# Patient Record
Sex: Female | Born: 1960 | Race: White | Hispanic: No | Smoking: Never smoker
Health system: Southern US, Community
[De-identification: ages and names within clinical notes are randomized; demographics above are authoritative.]

## PROBLEM LIST (undated history)

## (undated) DIAGNOSIS — C4491 Basal cell carcinoma of skin, unspecified: Secondary | ICD-10-CM

## (undated) DIAGNOSIS — E039 Hypothyroidism, unspecified: Secondary | ICD-10-CM

## (undated) DIAGNOSIS — K635 Polyp of colon: Secondary | ICD-10-CM

## (undated) DIAGNOSIS — A6000 Herpesviral infection of urogenital system, unspecified: Secondary | ICD-10-CM

## (undated) DIAGNOSIS — R0600 Dyspnea, unspecified: Secondary | ICD-10-CM

## (undated) HISTORY — DX: Polyp of colon: K63.5

## (undated) HISTORY — DX: Herpesviral infection of urogenital system, unspecified: A60.00

## (undated) HISTORY — DX: Basal cell carcinoma of skin, unspecified: C44.91

## (undated) HISTORY — DX: Hypothyroidism, unspecified: E03.9

## (undated) HISTORY — PX: OTHER SURGICAL HISTORY: SHX169

## (undated) HISTORY — DX: Dyspnea, unspecified: R06.00

---
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
## 2002-06-24 HISTORY — PX: CHOLECYSTECTOMY: SHX55

## 2004-05-14 ENCOUNTER — Ambulatory Visit: Payer: Self-pay | Admitting: Internal Medicine

## 2004-06-05 ENCOUNTER — Ambulatory Visit: Payer: Self-pay | Admitting: Internal Medicine

## 2004-08-09 ENCOUNTER — Ambulatory Visit: Payer: Self-pay | Admitting: Family Medicine

## 2004-10-29 ENCOUNTER — Ambulatory Visit: Payer: Self-pay | Admitting: Internal Medicine

## 2004-12-12 ENCOUNTER — Ambulatory Visit: Payer: Self-pay | Admitting: Internal Medicine

## 2005-01-19 ENCOUNTER — Ambulatory Visit: Payer: Self-pay | Admitting: Internal Medicine

## 2005-03-08 ENCOUNTER — Ambulatory Visit: Payer: Self-pay | Admitting: Internal Medicine

## 2005-06-21 ENCOUNTER — Encounter: Payer: Self-pay | Admitting: Internal Medicine

## 2005-06-21 ENCOUNTER — Ambulatory Visit: Payer: Self-pay | Admitting: Internal Medicine

## 2005-06-21 ENCOUNTER — Other Ambulatory Visit: Admission: RE | Admit: 2005-06-21 | Discharge: 2005-06-21 | Payer: Self-pay | Admitting: Internal Medicine

## 2006-03-01 ENCOUNTER — Ambulatory Visit: Payer: Self-pay | Admitting: Family Medicine

## 2006-05-13 ENCOUNTER — Ambulatory Visit: Payer: Self-pay | Admitting: Internal Medicine

## 2006-06-16 ENCOUNTER — Ambulatory Visit: Payer: Self-pay | Admitting: Internal Medicine

## 2006-06-16 LAB — CONVERTED CEMR LAB
ALT: 22 units/L (ref 0–40)
CO2: 30 meq/L (ref 19–32)
Chloride: 112 meq/L (ref 96–112)
Cholesterol: 165 mg/dL (ref 0–200)
Creatinine, Ser: 0.7 mg/dL (ref 0.4–1.2)
Glucose, Bld: 78 mg/dL (ref 70–99)
HDL: 56.8 mg/dL (ref >39.0)
MCHC: 34.2 g/dL (ref 30.0–36.0)
MCV: 98.6 fL (ref 78.0–100.0)
Platelets: 215 10*3/uL (ref 150–400)
RBC: 4.03 M/uL (ref 3.87–5.11)
RDW: 11.6 % (ref 11.5–14.6)
Sodium: 145 meq/L (ref 135–145)
Triglyceride fasting, serum: 57 mg/dL (ref 0–149)
VLDL: 11 mg/dL (ref 0–40)

## 2006-09-09 ENCOUNTER — Ambulatory Visit: Payer: Self-pay | Admitting: Internal Medicine

## 2007-02-05 ENCOUNTER — Telehealth (INDEPENDENT_AMBULATORY_CARE_PROVIDER_SITE_OTHER): Payer: Self-pay | Admitting: *Deleted

## 2007-03-19 ENCOUNTER — Ambulatory Visit: Payer: Self-pay | Admitting: Internal Medicine

## 2007-03-19 LAB — CONVERTED CEMR LAB
Bilirubin Urine: NEGATIVE
Blood in Urine, dipstick: NEGATIVE
Glucose, Urine, Semiquant: NEGATIVE
Ketones, urine, test strip: NEGATIVE
Protein, U semiquant: NEGATIVE
Urobilinogen, UA: NEGATIVE

## 2007-04-22 ENCOUNTER — Telehealth (INDEPENDENT_AMBULATORY_CARE_PROVIDER_SITE_OTHER): Payer: Self-pay | Admitting: *Deleted

## 2007-04-23 ENCOUNTER — Encounter (INDEPENDENT_AMBULATORY_CARE_PROVIDER_SITE_OTHER): Payer: Self-pay | Admitting: *Deleted

## 2007-04-27 ENCOUNTER — Telehealth (INDEPENDENT_AMBULATORY_CARE_PROVIDER_SITE_OTHER): Payer: Self-pay | Admitting: *Deleted

## 2007-06-17 ENCOUNTER — Ambulatory Visit: Payer: Self-pay | Admitting: Internal Medicine

## 2007-06-19 ENCOUNTER — Ambulatory Visit: Payer: Self-pay | Admitting: Internal Medicine

## 2007-06-19 LAB — CONVERTED CEMR LAB: Rapid Strep: NEGATIVE

## 2008-02-16 ENCOUNTER — Telehealth: Payer: Self-pay | Admitting: Internal Medicine

## 2008-02-17 ENCOUNTER — Ambulatory Visit: Payer: Self-pay | Admitting: Internal Medicine

## 2008-03-04 ENCOUNTER — Encounter: Payer: Self-pay | Admitting: Internal Medicine

## 2008-04-07 ENCOUNTER — Telehealth: Payer: Self-pay | Admitting: Internal Medicine

## 2008-04-12 ENCOUNTER — Telehealth (INDEPENDENT_AMBULATORY_CARE_PROVIDER_SITE_OTHER): Payer: Self-pay | Admitting: *Deleted

## 2008-04-19 ENCOUNTER — Ambulatory Visit: Payer: Self-pay | Admitting: Family Medicine

## 2008-06-21 ENCOUNTER — Ambulatory Visit: Payer: Self-pay | Admitting: Internal Medicine

## 2008-08-30 ENCOUNTER — Telehealth: Payer: Self-pay | Admitting: Internal Medicine

## 2008-08-30 ENCOUNTER — Ambulatory Visit: Payer: Self-pay | Admitting: Internal Medicine

## 2008-08-31 ENCOUNTER — Telehealth: Payer: Self-pay | Admitting: Internal Medicine

## 2008-08-31 ENCOUNTER — Ambulatory Visit: Payer: Self-pay | Admitting: Internal Medicine

## 2008-09-07 DIAGNOSIS — E039 Hypothyroidism, unspecified: Secondary | ICD-10-CM | POA: Insufficient documentation

## 2008-09-08 ENCOUNTER — Ambulatory Visit: Payer: Self-pay | Admitting: Cardiovascular Disease

## 2008-09-21 ENCOUNTER — Ambulatory Visit: Payer: Self-pay

## 2008-09-21 ENCOUNTER — Encounter: Payer: Self-pay | Admitting: Cardiovascular Disease

## 2008-09-26 ENCOUNTER — Ambulatory Visit: Payer: Self-pay | Admitting: Internal Medicine

## 2008-09-26 ENCOUNTER — Ambulatory Visit: Payer: Self-pay | Admitting: Cardiology

## 2008-09-27 ENCOUNTER — Encounter (INDEPENDENT_AMBULATORY_CARE_PROVIDER_SITE_OTHER): Payer: Self-pay | Admitting: *Deleted

## 2008-09-28 ENCOUNTER — Telehealth: Payer: Self-pay | Admitting: Internal Medicine

## 2008-09-30 ENCOUNTER — Encounter: Payer: Self-pay | Admitting: Internal Medicine

## 2009-02-28 ENCOUNTER — Ambulatory Visit: Payer: Self-pay | Admitting: Family Medicine

## 2009-03-09 ENCOUNTER — Emergency Department (HOSPITAL_BASED_OUTPATIENT_CLINIC_OR_DEPARTMENT_OTHER): Admission: EM | Admit: 2009-03-09 | Discharge: 2009-03-10 | Payer: Self-pay | Admitting: Emergency Medicine

## 2009-03-09 ENCOUNTER — Ambulatory Visit: Payer: Self-pay | Admitting: Diagnostic Radiology

## 2009-03-09 ENCOUNTER — Encounter: Payer: Self-pay | Admitting: Internal Medicine

## 2009-03-10 ENCOUNTER — Telehealth (INDEPENDENT_AMBULATORY_CARE_PROVIDER_SITE_OTHER): Payer: Self-pay | Admitting: *Deleted

## 2009-04-14 ENCOUNTER — Ambulatory Visit: Payer: Self-pay | Admitting: Family Medicine

## 2009-05-01 ENCOUNTER — Telehealth (INDEPENDENT_AMBULATORY_CARE_PROVIDER_SITE_OTHER): Payer: Self-pay | Admitting: *Deleted

## 2009-07-06 ENCOUNTER — Telehealth (INDEPENDENT_AMBULATORY_CARE_PROVIDER_SITE_OTHER): Payer: Self-pay | Admitting: *Deleted

## 2009-07-12 ENCOUNTER — Ambulatory Visit: Payer: Self-pay | Admitting: Internal Medicine

## 2009-07-20 ENCOUNTER — Telehealth: Payer: Self-pay | Admitting: Internal Medicine

## 2009-07-20 ENCOUNTER — Telehealth (INDEPENDENT_AMBULATORY_CARE_PROVIDER_SITE_OTHER): Payer: Self-pay | Admitting: *Deleted

## 2009-07-25 ENCOUNTER — Encounter: Payer: Self-pay | Admitting: Internal Medicine

## 2010-04-17 ENCOUNTER — Telehealth (INDEPENDENT_AMBULATORY_CARE_PROVIDER_SITE_OTHER): Payer: Self-pay | Admitting: *Deleted

## 2010-07-20 ENCOUNTER — Other Ambulatory Visit: Payer: Self-pay | Admitting: Internal Medicine

## 2010-07-20 ENCOUNTER — Ambulatory Visit
Admission: RE | Admit: 2010-07-20 | Discharge: 2010-07-20 | Payer: Self-pay | Source: Home / Self Care | Attending: Internal Medicine | Admitting: Internal Medicine

## 2010-07-20 DIAGNOSIS — A6000 Herpesviral infection of urogenital system, unspecified: Secondary | ICD-10-CM | POA: Insufficient documentation

## 2010-07-20 LAB — CBC WITH DIFFERENTIAL/PLATELET
Eosinophils Relative: 2.2 % (ref 0.0–5.0)
HCT: 37.2 % (ref 36.0–46.0)
Lymphs Abs: 1.8 10*3/uL (ref 0.7–4.0)
MCV: 97.9 fl (ref 78.0–100.0)
Monocytes Absolute: 0.3 10*3/uL (ref 0.1–1.0)
Platelets: 197 10*3/uL (ref 150.0–400.0)
RDW: 11.9 % (ref 11.5–14.6)
WBC: 5.4 10*3/uL (ref 4.5–10.5)

## 2010-07-20 LAB — ALT: ALT: 19 U/L (ref 0–35)

## 2010-07-20 LAB — BASIC METABOLIC PANEL
BUN: 14 mg/dL (ref 6–23)
CO2: 27 mEq/L (ref 19–32)
Chloride: 107 mEq/L (ref 96–112)
Glucose, Bld: 79 mg/dL (ref 70–99)
Potassium: 4.1 mEq/L (ref 3.5–5.1)

## 2010-07-20 LAB — LIPID PANEL
HDL: 59.2 mg/dL (ref >39.00)
Total CHOL/HDL Ratio: 3

## 2010-07-20 LAB — TSH: TSH: 0.16 u[IU]/mL — ABNORMAL LOW (ref 0.35–5.50)

## 2010-07-22 LAB — CONVERTED CEMR LAB
ALT: 18 units/L (ref 0–35)
ALT: 21 units/L (ref 0–35)
AST: 21 units/L (ref 0–37)
Albumin: 3.8 g/dL (ref 3.5–5.2)
Albumin: 4 g/dL (ref 3.5–5.2)
Albumin: 4.1 g/dL (ref 3.5–5.2)
Alkaline Phosphatase: 46 units/L (ref 39–117)
Alkaline Phosphatase: 51 units/L (ref 39–117)
Alkaline Phosphatase: 52 units/L (ref 39–117)
BUN: 10 mg/dL (ref 6–23)
BUN: 11 mg/dL (ref 6–23)
Basophils Absolute: 0 10*3/uL (ref 0.0–0.1)
Basophils Absolute: 0 10*3/uL (ref 0.0–0.1)
Basophils Relative: 0.1 % (ref 0.0–1.0)
Basophils Relative: 0.3 % (ref 0.0–3.0)
CO2: 29 meq/L (ref 19–32)
CO2: 29 meq/L (ref 19–32)
Calcium: 9 mg/dL (ref 8.4–10.5)
Calcium: 9.1 mg/dL (ref 8.4–10.5)
Chloride: 105 meq/L (ref 96–112)
Cholesterol: 168 mg/dL (ref 0–200)
Cholesterol: 169 mg/dL (ref 0–200)
Eosinophils Absolute: 0.2 10*3/uL (ref 0.0–0.7)
Eosinophils Relative: 3.8 % (ref 0.0–5.0)
Folate: 15.8 ng/mL
GFR calc Af Amer: 116 mL/min
GFR calc Af Amer: 116 mL/min
GFR calc non Af Amer: 95.2 mL/min (ref >60)
GFR calc non Af Amer: 96 mL/min
HCT: 38.9 % (ref 36.0–46.0)
HCT: 39.8 % (ref 36.0–46.0)
HDL: 58.4 mg/dL (ref >39.0)
HDL: 58.9 mg/dL (ref >39.0)
Hemoglobin: 13.2 g/dL (ref 12.0–15.0)
LDL Cholesterol: 94 mg/dL (ref 0–99)
Lymphocytes Relative: 33.2 % (ref 12.0–46.0)
Lymphs Abs: 1.6 10*3/uL (ref 0.7–4.0)
MCHC: 33.3 g/dL (ref 30.0–36.0)
MCHC: 35 g/dL (ref 30.0–36.0)
MCHC: 35 g/dL (ref 30.0–36.0)
MCV: 93.9 fL (ref 78.0–100.0)
Monocytes Absolute: 0.4 10*3/uL (ref 0.2–0.7)
Monocytes Absolute: 0.6 10*3/uL (ref 0.1–1.0)
Monocytes Relative: 6.1 % (ref 3.0–11.0)
Monocytes Relative: 6.4 % (ref 3.0–12.0)
Neutro Abs: 2.8 10*3/uL (ref 1.4–7.7)
Neutro Abs: 3.8 10*3/uL (ref 1.4–7.7)
Neutrophils Relative %: 46.7 % (ref 43.0–77.0)
Pap Smear: NORMAL
Platelets: 177 10*3/uL (ref 150–400)
Platelets: 193 10*3/uL (ref 150–400)
Potassium: 3.9 meq/L (ref 3.5–5.1)
Potassium: 4 meq/L (ref 3.5–5.1)
Potassium: 4 meq/L (ref 3.5–5.1)
RBC: 4.03 M/uL (ref 3.87–5.11)
RDW: 11.2 % — ABNORMAL LOW (ref 11.5–14.6)
RDW: 11.3 % — ABNORMAL LOW (ref 11.5–14.6)
Sodium: 138 meq/L (ref 135–145)
Sodium: 140 meq/L (ref 135–145)
TSH: 0.46 microintl units/mL (ref 0.35–5.50)
TSH: 0.97 microintl units/mL (ref 0.35–5.50)
Total Bilirubin: 1.4 mg/dL — ABNORMAL HIGH (ref 0.3–1.2)
Total Protein: 6.7 g/dL (ref 6.0–8.3)
Total Protein: 6.9 g/dL (ref 6.0–8.3)
Triglycerides: 75 mg/dL (ref 0–149)
Triglycerides: 83 mg/dL (ref 0–149)
VLDL: 15 mg/dL (ref 0–40)
VLDL: 17 mg/dL (ref 0–40)
Vit D, 25-Hydroxy: 36 ng/mL (ref 30–89)
Vitamin B-12: 506 pg/mL (ref 211–911)

## 2010-07-24 NOTE — Consult Note (Signed)
Summary: [facility]   Imported By: Lanelle Bal 08/02/2009 08:50:33  _____________________________________________________________________  External Attachment:    Type:   Image     Comment:   External Document

## 2010-07-24 NOTE — Progress Notes (Signed)
Summary: refill  Phone Note Refill Request Message from:  [facility] fax [phone]  Refills Requested: Medication #1:  SYNTHROID 112 MCG TABS 1 by mouth once daily Initial call taken by: Barb Merino,  July 20, 2009 8:30 AM    Prescriptions: SYNTHROID 112 MCG TABS (LEVOTHYROXINE SODIUM) 1 by mouth once daily  #90 x 1   Entered by:   Kandice Hams   Authorized by:   Nolon Rod. Paz MD   Signed by:   Kandice Hams on 07/20/2009   Method used:   Printed then faxed to ...       [facility])       [address] [address]       [place], [place]  [address]       Ph: [phone]       Fax: [phone]   RxID:   [id number]

## 2010-07-24 NOTE — Progress Notes (Signed)
Summary: ? lab results  Phone Note Call from Patient Call back at Home Phone [phone] Call back at Work Phone [phone]   Details for Reason: PATIENT LEFT MESSAGE ON VM CONCERNED ABOUT LABS: Summary of Call: Patient was reviewing copy of labs that she received in mail.  She wants to know 1) if her low RDW is what is causing her to feel bad & 2) should she be concerned that her bili is elevated because it has been elevated before.  Is that causing any of her problems? Initial call taken by: Shary Decamp,  July 20, 2009 1:29 PM  Follow-up for Phone Call        -- I don't believe the slightly  abnormal RDW or elevated bilirrubin  are  causing any symptoms. --since her other LFTs are normal the increase of bilir. just need to be monitored from time to time  [place] E. Shooter Tangen MD  July 20, 2009 5:23 PM   Left message on machine for pt to return call Shary Decamp  July 21, 2009 11:17 AM  discussed with pt Shary Decamp  July 21, 2009 11:59 AM

## 2010-07-24 NOTE — Assessment & Plan Note (Signed)
Summary: CPX, FASTING, NS FEE/RH.....   Vital Signs:  Patient profile:   50 year old female Height:      63 inches Weight:      150.6 pounds BMI:     26.77 Pulse rate:   60 / minute Pulse rhythm:   regular BP sitting:   120 / 80  (left arm) Cuff size:   regular  Vitals Entered By: Shary Decamp (July 12, 2009 7:59 AM) CC: cpx - fasting, no pap (has gyn) Is Patient Diabetic? No Comments  - fatigue x 6 months  - jaw popping when she opens her mouth off & on x 1 year  - still c/io of chest pains - she started exercising again & c/o of pain with exercise & dull pain after exercise Shary Decamp  July 12, 2009 8:05 AM    History of Present Illness: CPX and several other issues :    generalize fatigue x 6 months, "hard to get up from bed", no snoring that she knows off  jaw popping when she opens her mouth off & on x 1 year, if put pressure on it feels better    still c/io of chest pains, exertional, R from the sternum, decrease w/ rest after exertion, the chest remains sore to touch  able to run 12 miles/week   Preventive Screening-Counseling & Management  Alcohol-Tobacco     Alcohol drinks/day: 0     Smoking Status: never  Caffeine-Diet-Exercise     Caffeine use/day: 2     Does Patient Exercise: yes     Times/week: 3  Current Medications (verified): 1)  Synthroid 112 Mcg Tabs (Levothyroxine Sodium) .Marland Kitchen.. 1 By Mouth Once Daily 2)  Acyclovir 400 Mg Tabs (Acyclovir) .... Two Times A Day 3)  Nature's Bounty Vitamins For Women 4)  Balziva 0.4-35 Mg-Mcg Tabs (Norethindrone-Eth Estradiol) .... Once Daily  Allergies (verified): No Known Drug Allergies  Past History:  Past Medical History: Hypothyroidism h/o colon polyps  BCC, s/p MOHs 2009 DOE- CP, 3-10 normal stress  ECHO, 4-10 CT neg for PE, saw pulmonary  Past Surgical History: Reviewed history from 06/17/2007 and no changes required. Appendectomy Caesarean section Cholecystectomy uterine  ablation  Family History: DM-- aunts CAD--no stroke -- M age 33 dementia (early)-- mother Breast ca-G-aunt Ovarian Ca--aunts colon ca--distant relative (F uncle) polyps, colon--father depression--father  Social History: Reviewed history from 09/07/2008 and no changes required. Single 1 child tobacco--no ETOH-- rarely  Occupation:finances (sedentary) very active runner Does Patient Exercise:  yes Caffeine use/day:  2  Review of Systems General:  Denies fever and weight loss; "can't loose wt despite running 12  miles a week". CV:  Denies near fainting, palpitations, and swelling of feet. Resp:  Denies cough and wheezing. GI:  Denies bloody stools, diarrhea, nausea, and vomiting. GU:  sees gyn no periods . Psych:  Denies anxiety and depression.  Physical Exam  General:  alert, well-developed, and well-nourished.   Mouth:  TMJ no clik Neck:  no masses and no thyromegaly.   Lungs:  normal respiratory effort, no intercostal retractions, no accessory muscle use, and normal breath sounds.   Heart:  normal rate, regular rhythm, no murmur, and no gallop.   Abdomen:  soft, non-tender, no distention, no masses, no guarding, and no rigidity.   Extremities:  no pretibial edema bilaterally  Psych:  Oriented X3, memory intact for recent and remote, normally interactive, good eye contact, not anxious appearing, and not depressed appearing.     Impression &  Recommendations:  Problem # 1:  HEALTH SCREENING (ICD-V70.0) Td 2007 had flu shot  Cscope x 2 ,  06-2005 (in [facility]) they found polyps; repeated Cscope 2008 normal.  next 5 years  (per patient ) cont healthy life style female care per gyn    Orders: Venipuncture (60454) TLB-BMP (Basic Metabolic Panel-BMET) (80048-METABOL) TLB-CBC Platelet - w/Differential (85025-CBCD) TLB-Hepatic/Liver Function Pnl (80076-HEPATIC) TLB-Lipid Panel (80061-LIPID)  Problem # 2:  CHEST PAIN (ICD-786.50) persistent  exertional CP chart  reviewed ---->   3-10 normal stress  ECHO, 4-10 CT neg for PE, saw pulmonary (no evidence of asthma) w/u has been reassuring she is able to run 12 miles/week despite atypical CP EKG today at baseline costochondral pain  related to exercise ? her options are: observe Cards reavaluation pulmonary reevaluation  patient would prefers a re-eval by cards in [facility] if all the labs are ok  Orders: EKG w/ Interpretation (93000)  Problem # 3:  HYPOTHYROIDISM (ICD-244.9) due for labs  Her updated medication list for this problem includes:    Synthroid 112 Mcg Tabs (Levothyroxine sodium) .Marland Kitchen... 1 by mouth once daily  Labs Reviewed: TSH: 0.55 (06/21/2008)    Chol: 169 (06/21/2008)   HDL: 58.4 (06/21/2008)   LDL: 94 (06/21/2008)   TG: 83 (06/21/2008)  Orders: TLB-TSH (Thyroid Stimulating Hormone) (84443-TSH)  Problem # 4:  FATIGUE (ICD-780.79) see HPI labs will help r/o secondary etiologies   Orders: T-Vitamin D (25-Hydroxy) (09811-91478) TLB-B12 + Folate Pnl (29562_13086-V78/ION)  Problem # 5:  TMJ PAIN (ICD-524.62) rec  dental eval   Complete Medication List: 1)  Synthroid 112 Mcg Tabs (Levothyroxine sodium) .Marland Kitchen.. 1 by mouth once daily 2)  Acyclovir 400 Mg Tabs (Acyclovir) .... Two times a day 3)  Nature's Bounty Vitamins For Women  4)  Balziva 0.4-35 Mg-mcg Tabs (Norethindrone-eth estradiol) .... Once daily  Patient Instructions: 1)  Please schedule a follow-up appointment in 3  months .    Preventive Care Screening  Mammogram:    Date:  10/22/2008    Results:  normal   Pap Smear:    Date:  05/24/2008    Results:  normal     Immunization History:  Tetanus/Td Immunization History:    Tetanus/Td:  per pt (06/24/2005)  Influenza Immunization History:    Influenza:  done @ work (03/24/2009)

## 2010-07-24 NOTE — Progress Notes (Signed)
  Phone Note Call from Patient Call back at Work Phone [phone]   Caller: Patient Summary of Call: pt called having increased chest pains not every day mostly after exercising running or swimming, would like a referral to a heart doctor in the [place] area. pt has appt for cpx next wed 07/12/09. Informed pt would need ov to be seen, pt says she has appt next week. Recommend to pt is signs increase or worsen between now and then recommend ED for assessment, pt agreed and she is familiarwith [facility]  Initial call taken by: Kandice Hams,  July 06, 2009 11:38 AM

## 2010-07-24 NOTE — Progress Notes (Signed)
Summary: Thrombosed Hemorrhoid  Thrombosed Hemorrhoid   Imported By: Maryln Gottron 12/11/2009 15:52:24  _____________________________________________________________________  External Attachment:    Type:   Image     Comment:   External Document

## 2010-07-24 NOTE — Progress Notes (Signed)
Summary: REFILL--LMOM 10/25, appt sched--has question  Phone Note Refill Request Call back at Work Phone [phone] Message from:  Patient on April 17, 2010 8:40 AM  Refills Requested: Medication #1:  SYNTHROID 112 MCG TABS 1 by mouth once daily   Dosage confirmed as above?Dosage Confirmed   Supply Requested: 3 months   Last Refilled: 10/09/2009 [facility]  Initial call taken by: Lavell Islam,  April 17, 2010 8:41 AM  Follow-up for Phone Call        Pt needs an appt with Dr. Drue Novel. Follow-up by: Army Fossa CMA,  April 17, 2010 8:51 AM  Additional Follow-up for Phone Call Additional follow up Details #1::        LMOM (WORK NUMBER) TO CALL AND SCHEDULE OFFICE VISIT  WITH DR PAZ---LAST CPX = 07/12/2009 Additional Follow-up by: Jerolyn Shin,  April 17, 2010 12:43 PM    Additional Follow-up for Phone Call Additional follow up Details #2::    patient has scheduled CPX and fasting labs for 07/17/2010 at 8:00---does she need to be seen any earlier than this appointment?  YES?--please call work number = [phone] or home = [phone]  NO?--does not need phone call   Follow-up by: Jerolyn Shin,  April 17, 2010 1:04 PM  Additional Follow-up for Phone Call Additional follow up Details #3:: Details for Additional Follow-up Action Taken: that appt will be fine. Army Fossa CMA  April 17, 2010 1:08 PM   Prescriptions: SYNTHROID 112 MCG TABS (LEVOTHYROXINE SODIUM) 1 by mouth once daily  #90 x 0   Entered by:   Army Fossa CMA   Authorized by:   Nolon Rod. Paz MD   Signed by:   Army Fossa CMA on 04/17/2010   Method used:   Faxed to ...       [facility] Fax) (mail-order)             ,          Ph: [phone]       Fax: [phone]   RxID:   [id number]

## 2010-08-01 NOTE — Assessment & Plan Note (Signed)
Summary: cpx and fasting labs///sph   Vital Signs:  Patient profile:   50 year old female Height:      63 inches Weight:      145.19 pounds BMI:     25.81 Pulse rate:   76 / minute Pulse rhythm:   regular BP sitting:   110 / 80  (left arm) Cuff size:   regular  Vitals Entered By: Army Fossa CMA (July 20, 2010 7:56 AM) CC: CPX, fasting  Comments no complaints  discuss synthroid [facility] [address] due for pap in feb   History of Present Illness: CPX doing well  Preventive Screening-Counseling & Management  Alcohol-Tobacco     Alcohol type: rare  Current Medications (verified): 1)  Synthroid 112 Mcg Tabs (Levothyroxine Sodium) .Marland Kitchen.. 1 By Mouth Once Daily 2)  Valacyclovir Hcl 1 Gm Tabs (Valacyclovir Hcl) .... 1/2 Tab By Mouth Two Times A Day. 3)  Balziva 0.4-35 Mg-Mcg Tabs (Norethindrone-Eth Estradiol) .... Once Daily  Allergies (verified): No Known Drug Allergies  Past History:  Past Medical History: Reviewed history from 07/12/2009 and no changes required. Hypothyroidism h/o colon polyps  BCC, s/p MOHs 2009 DOE- CP, 3-10 normal stress  ECHO, 4-10 CT neg for PE, saw pulmonary  Past Surgical History: Reviewed history from 06/17/2007 and no changes required. Appendectomy Caesarean section Cholecystectomy uterine ablation  Family History: Reviewed history from 07/12/2009 and no changes required. DM-- aunts CAD--no stroke -- M age 36 dementia (early)-- mother Breast ca-G-aunt Ovarian Ca--aunts colon ca--distant relative (F uncle) polyps, colon--father depression--father  Social History: Single 1 child, boy 54 y/o  tobacco--no ETOH-- rarely  Occupation:finances (sedentary) very active runner  diet-- good   Review of Systems General:  Denies fever and weight loss; mild fatigue (she is a runner, doing boot camp!!). CV:  Denies chest pain or discomfort and swelling of feet. Resp:  Denies cough, shortness of breath, and sputum productive. GI:   Denies bloody stools, diarrhea, nausea, and vomiting. Psych:  Denies anxiety and depression.  Physical Exam  General:  alert, well-developed, and well-nourished.   Neck:  no masses and no thyromegaly.  normal carotid pulse Lungs:  normal respiratory effort, no intercostal retractions, no accessory muscle use, and normal breath sounds.   Heart:  normal rate, regular rhythm, no murmur, and no gallop.   Abdomen:  soft, non-tender, no distention, no masses, no guarding, and no rigidity.   Extremities:  no pretibial edema bilaterally  Psych:  Cognition and judgment appear intact. Alert and cooperative with normal attention span and concentration.     Impression & Recommendations:  Problem # 1:  HEALTH SCREENING (ICD-V70.0) doing great Td 2007 recommend flu shot   Cscope x 2 ,  06-2005 (in [facility]) they found polyps; repeated Cscope 2008 normal.  next 5 years  (per patient )  cont healthy life style female care per gyn  Orders: Venipuncture (16109) TLB-BMP (Basic Metabolic Panel-BMET) (80048-METABOL) TLB-CBC Platelet - w/Differential (85025-CBCD) TLB-ALT (SGPT) (84460-ALT) TLB-AST (SGOT) (84450-SGOT) TLB-Lipid Panel (80061-LIPID) TLB-TSH (Thyroid Stimulating Hormone) (84443-TSH) Specimen Handling (60454)  Problem # 2:  HYPOTHYROIDISM (ICD-244.9) would like to increase dose  a little bit if possible, we agreed on a TSH goal close to 0.3  adjust medicines if necessary Her updated medication list for this problem includes:    Synthroid 112 Mcg Tabs (Levothyroxine sodium) .Marland Kitchen... 1 by mouth once daily  Labs Reviewed: TSH: 0.97 (07/12/2009)    Chol: 184 (07/12/2009)   HDL: 62.90 (07/12/2009)   LDL: 105 (  07/12/2009)   TG: 83.0 (07/12/2009)  Problem # 3:  GENITAL HERPES (ICD-054.10) takes Valtrex 1 g half tablet daily, only takes it twice a day if she feels a episode is coming  Complete Medication List: 1)  Synthroid 112 Mcg Tabs (Levothyroxine sodium) .Marland Kitchen.. 1 by mouth once  daily 2)  Valacyclovir Hcl 1 Gm Tabs (Valacyclovir hcl) .... 1/2 tab by mouth two times a day. 3)  Balziva 0.4-35 Mg-mcg Tabs (Norethindrone-eth estradiol) .... Once daily  Patient Instructions: 1)  Please schedule a follow-up appointment in 1 year.    Orders Added: 1)  Venipuncture [36415] 2)  TLB-BMP (Basic Metabolic Panel-BMET) [80048-METABOL] 3)  TLB-CBC Platelet - w/Differential [85025-CBCD] 4)  TLB-ALT (SGPT) [84460-ALT] 5)  TLB-AST (SGOT) [84450-SGOT] 6)  TLB-Lipid Panel [80061-LIPID] 7)  TLB-TSH (Thyroid Stimulating Hormone) [84443-TSH] 8)  Specimen Handling [99000] 9)  Est. Patient age 33-64 [87]     Risk Factors:  Alcohol use:  yes    Type:  rare  Mammogram History:     Date of Last Mammogram:  10/22/2009    Results:  normal     Preventive Care Screening  Mammogram:    Date:  10/22/2009    Results:  normal

## 2010-08-09 ENCOUNTER — Telehealth: Payer: Self-pay | Admitting: Internal Medicine

## 2010-08-15 ENCOUNTER — Encounter (INDEPENDENT_AMBULATORY_CARE_PROVIDER_SITE_OTHER): Payer: Self-pay | Admitting: *Deleted

## 2010-08-15 ENCOUNTER — Other Ambulatory Visit (INDEPENDENT_AMBULATORY_CARE_PROVIDER_SITE_OTHER): Payer: PRIVATE HEALTH INSURANCE

## 2010-08-15 ENCOUNTER — Other Ambulatory Visit: Payer: Self-pay | Admitting: Internal Medicine

## 2010-08-15 DIAGNOSIS — E039 Hypothyroidism, unspecified: Secondary | ICD-10-CM

## 2010-08-15 LAB — TSH: TSH: 0.35 u[IU]/mL (ref 0.35–5.50)

## 2010-08-15 NOTE — Progress Notes (Signed)
Summary: Synthroid issue  Phone Note Call from Patient Call back at Work Phone [phone]   Summary of Call: Patient notes that her synthroid was changed recently and she notes that she has gained 5 pounds in a week and can sleep all the time. Patient would like this med adjusted. Please advise.  Initial call taken by: Lucious Groves CMA,  August 09, 2010 8:05 AM  Follow-up for Phone Call        her Synthroid was decreased  from 112 to 100 micrograms daily based on her last TSH which was very low. Is she taking 100 mcg once a day? Please get a TSH Follow-up by: Bearett Porcaro E. Cerria Randhawa MD,  August 09, 2010 12:28 PM  Additional Follow-up for Phone Call Additional follow up Details #1::        Patient notified and confirms that she is only taking once daily. Lucious Groves CMA  August 09, 2010 2:27 PM

## 2010-09-28 LAB — COMPREHENSIVE METABOLIC PANEL
Alkaline Phosphatase: 77 U/L (ref 39–117)
BUN: 16 mg/dL (ref 6–23)
Calcium: 9.8 mg/dL (ref 8.4–10.5)
Creatinine, Ser: 0.8 mg/dL (ref 0.4–1.2)
Glucose, Bld: 87 mg/dL (ref 70–99)
Total Protein: 7.2 g/dL (ref 6.0–8.3)

## 2010-09-28 LAB — DIFFERENTIAL
Basophils Relative: 0 % (ref 0–1)
Monocytes Relative: 6 % (ref 3–12)
Neutro Abs: 7.6 10*3/uL (ref 1.7–7.7)
Neutrophils Relative %: 70 % (ref 43–77)

## 2010-09-28 LAB — CBC
Platelets: 195 10*3/uL (ref 150–400)
RBC: 4.31 MIL/uL (ref 3.87–5.11)
WBC: 10.9 10*3/uL — ABNORMAL HIGH (ref 4.0–10.5)

## 2010-11-06 NOTE — Assessment & Plan Note (Signed)
[facility]                            CARDIOLOGY OFFICE NOTE   NAME:Rachel Maynard, Rachel Maynard                          MRN:          [id number]  DATE:09/08/2008                            DOB:          02/19/1961    A 50 year old patient with atypical chest pain and shortness of breath.  The patient has been having atypical chest pain for 2 years.  This has  been slightly worse over the last couple of months.  She tends to get it  when she runs.  She gets a sharp sensation in her chest that can radiate  up towards the shoulders.  She will continue to run and it will go away.  Sometimes the pains will then recur the next day.  These can be at rest.  They only last a minute or so.  The pain is usually sharp.  It is not  associated with any diaphoresis, palpitations, or presyncope.  She can  get occasional shortness of breath with this.  She tends not to run much  in the wintertime and gets more short of breath when she starts out  again in the spring or summer.  This sounds more like deconditioning.  She has not had a cough or fever.  There is been no sputum production.  She is a nonsmoker.  She has been on longstanding birth control, but has  not had a previous history of DVT or lower extremity edema.   Her review of systems is otherwise negative.   Her past medical history is remarkable for some chronic atypical chest  pain.  She has had a previous C-section, previous cholecystectomy,  previous appendectomy.   She also has hypothyroidism.  She is divorced.  He has one 70-year-old  son.  She works as an Airline pilot.  She primarily runs in the good  weather and is otherwise sedentary.  She has 1-2 caffeinated beverages a  day and uses alcohol occasionally.   Family history is noncontributory.  Mother and father are still alive.  She is on birth control pills, Synthroid 112 mcg a day, and Valtrex.   PHYSICAL EXAMINATION:  GENERAL:  Remarkable for healthy-appearing  middle-  aged female, in no distress.  Affect is appropriate.  VITAL SIGNS:  Blood pressure is 120/70, pulse 70 and regular, weight  146.  HEENT:  Unremarkable.  NECK:  Carotids are normal without bruit.  No lymphadenopathy,  thyromegaly, or JVP elevation.  LUNGS:  Clear with good diaphragmatic motion.  No wheezing.  HEART:  S1 and S2.  Normal heart sounds.  PMI normal.  ABDOMEN:  Benign.  Bowel sounds positive.  No AAA, no tenderness, no  bruit, no hepatosplenomegaly, no hepatojugular reflux.  EXTREMITIES:  Distal pulses intact.  No edema.  NEURO:  Nonfocal.  SKIN:  Warm and dry.  MUSCULOSKELETAL:  No muscular weakness.   EKG is normal.  Notes from Dr. Drue Novel also reviewed.   IMPRESSION:  1. Atypical chest pain.  Followup stress echocardiogram.  2. Hypothyroidism.  Continue Synthroid.  TSH and T4 per Dr. Drue Novel.  3. Birth control since  she is a nonsmoker and has not had      complications.  I think it is fine for her to continue her current      regimen.  4. History of herpes.  Continue Valtrex.  The patient will be seen on      an as-needed basis as long as her stress echocardiogram is normal.     Theron Arista C. Eden Emms, MD, [facility]  Electronically Signed    PCN/MedQ  DD: 09/08/2008  DT: 09/09/2008  Job #: [id number]   cc:   Willow Ora, MD

## 2010-11-09 NOTE — Assessment & Plan Note (Signed)
[facility]                                   ON-CALL NOTE   NAME:Rachel Maynard, Rachel Maynard                          MRN:          [id number]  DATE:03/01/2006                            DOB:          07-Jul-1960    TELEPHONE TRIAGE NOTE:   TIME RECEIVED:  Is 7:51 a.m.   The patient sees Dr. Drue Novel.   Telephone:  [phone].   The patient has had a painful swollen hemorrhoid for the past week.  She has  been using over-the-counter preparations and it will not go down.  There has  been no bleeding.  My response is to see me in our Saturday clinic later  this morning and we should be able to take care of it.                                   Tera Mater. Clent Ridges, MD   SAF/MedQ  DD:  03/01/2006  DT:  03/01/2006  Job #:  [id number]   cc:   Willow Ora, MD

## 2011-01-07 ENCOUNTER — Other Ambulatory Visit: Payer: Self-pay | Admitting: Internal Medicine

## 2011-01-07 DIAGNOSIS — E039 Hypothyroidism, unspecified: Secondary | ICD-10-CM

## 2011-01-08 ENCOUNTER — Other Ambulatory Visit (INDEPENDENT_AMBULATORY_CARE_PROVIDER_SITE_OTHER): Payer: PRIVATE HEALTH INSURANCE

## 2011-01-08 DIAGNOSIS — E039 Hypothyroidism, unspecified: Secondary | ICD-10-CM

## 2011-01-08 LAB — TSH: TSH: 0.77 u[IU]/mL (ref 0.35–5.50)

## 2011-01-10 ENCOUNTER — Telehealth: Payer: Self-pay | Admitting: *Deleted

## 2011-01-10 NOTE — Telephone Encounter (Signed)
Pt is aware.  

## 2011-01-10 NOTE — Telephone Encounter (Signed)
Message left for patient to return my call.  

## 2011-01-10 NOTE — Telephone Encounter (Signed)
Message copied by Leanne Lovely on Thu Jan 10, 2011  1:32 PM ------      Message from: Willow Ora E      Created: Thu Jan 10, 2011 12:41 PM       Advise patient,  thyroid well controlled, continue with same dose of Synthroid. Next office visit: January 2013 for a physical exam

## 2011-02-19 ENCOUNTER — Other Ambulatory Visit: Payer: Self-pay | Admitting: Internal Medicine

## 2011-02-19 NOTE — Telephone Encounter (Signed)
Rx Done . 

## 2011-06-03 ENCOUNTER — Ambulatory Visit: Payer: PRIVATE HEALTH INSURANCE | Admitting: Internal Medicine

## 2011-07-17 ENCOUNTER — Ambulatory Visit (INDEPENDENT_AMBULATORY_CARE_PROVIDER_SITE_OTHER): Payer: PRIVATE HEALTH INSURANCE | Admitting: Internal Medicine

## 2011-07-17 ENCOUNTER — Encounter: Payer: Self-pay | Admitting: Internal Medicine

## 2011-07-17 VITALS — BP 110/68 | HR 91 | Temp 99.2°F | Wt 157.4 lb

## 2011-07-17 DIAGNOSIS — J0100 Acute maxillary sinusitis, unspecified: Secondary | ICD-10-CM

## 2011-07-17 MED ORDER — FLUTICASONE PROPIONATE 50 MCG/ACT NA SUSP: 1.0000 | Freq: Two times a day (BID) | NASAL | Status: DC | PRN

## 2011-07-17 MED ORDER — CEFUROXIME AXETIL 500 MG PO TABS: 500.0000 mg | ORAL_TABLET | Freq: Two times a day (BID) | ORAL | Status: AC

## 2011-07-17 NOTE — Patient Instructions (Signed)
Plain Mucinex for thick secretions ;force NON dairy fluids .Nasal spray 1 spray in each nostril twice a day as needed. Use the "crossover" technique as discussed

## 2011-07-17 NOTE — Progress Notes (Signed)
  Subjective:    Patient ID: Rachel Maynard, female    DOB: [birth year], 51 y.o.   MRN: [id number]  HPI Respiratory tract infection Onset/symptoms:1 month ago as "cold" ; seen @ [facility] Rxed X 5 days . Well for 7-10 days then recurrence as rhinitis Exposures (illness/environmental/extrinsic):no Progression of symptoms: dental pain, epistaxis, eye pain Treatments/response: OTC allergy with minor benefit Present symptoms: Fever/chills/sweats:no Frontal headache:no Facial pain:yes Nasal purulence:green Sore throat:no Dental pain:maxillary Lymphadenopathy:no Wheezing/shortness of breath:no Cough/sputum/hemoptysis:no Associated extrinsic/allergic symptoms:itchy eyes/ sneezing:no Past medical history: Seasonal allergies : no/asthma:no Smoking history:never           Review of Systems     Objective:   Physical Exam General appearance:good health ;well nourished; no acute distress or increased work of breathing is present.  No  lymphadenopathy about the head, neck, or axilla noted.   Eyes: No conjunctival inflammation or lid edema is present. EOMI ; chronic decreased OS vision  Ears:  External ear exam shows no significant lesions or deformities.  Otoscopic examination reveals clear canals, tympanic membranes are intact bilaterally without bulging, retraction, inflammation or discharge.  Nose:  External nasal examination shows no deformity or inflammation. Nasal mucosa are  dry without lesions or exudates. No septal dislocation or deviation.No obstruction to airflow.   Oral exam: Dental hygiene is good; lips and gums are healthy appearing.There is no oropharyngeal erythema or exudate noted.      Heart:  Normal rate and regular rhythm. S1 and S2 normal without gallop, click, rub or other extra sounds. Grade 1/6 systolic murmur   Lungs:Chest clear to auscultation; no wheezes, rhonchi,rales ,or rubs present.No increased work of breathing.    Extremities:  No cyanosis, edema, or  clubbing  noted    Skin: Warm & dry          Assessment & Plan:  #1 acute maxillary sinusitis  Plan: See orders and recommendations

## 2011-08-14 ENCOUNTER — Encounter: Payer: Self-pay | Admitting: Internal Medicine

## 2011-08-14 ENCOUNTER — Ambulatory Visit (INDEPENDENT_AMBULATORY_CARE_PROVIDER_SITE_OTHER): Payer: PRIVATE HEALTH INSURANCE | Admitting: Internal Medicine

## 2011-08-14 DIAGNOSIS — Z0000 Encounter for general adult medical examination without abnormal findings: Secondary | ICD-10-CM | POA: Insufficient documentation

## 2011-08-14 LAB — CBC WITH DIFFERENTIAL/PLATELET
Basophils Absolute: 0 10*3/uL (ref 0.0–0.1)
Eosinophils Absolute: 0.1 10*3/uL (ref 0.0–0.7)
HCT: 37.5 % (ref 36.0–46.0)
Lymphs Abs: 1.6 10*3/uL (ref 0.7–4.0)
MCHC: 34.9 g/dL (ref 30.0–36.0)
MCV: 95.9 fl (ref 78.0–100.0)
Monocytes Absolute: 0.3 10*3/uL (ref 0.1–1.0)
Platelets: 191 10*3/uL (ref 150.0–400.0)
RDW: 12.5 % (ref 11.5–14.6)

## 2011-08-14 LAB — LIPID PANEL: Cholesterol: 166 mg/dL (ref 0–200)

## 2011-08-14 LAB — COMPREHENSIVE METABOLIC PANEL
ALT: 18 U/L (ref 0–35)
Alkaline Phosphatase: 60 U/L (ref 39–117)
CO2: 25 mEq/L (ref 19–32)
GFR: 91.35 mL/min (ref >60.00)
Sodium: 139 mEq/L (ref 135–145)
Total Bilirubin: 1.7 mg/dL — ABNORMAL HIGH (ref 0.3–1.2)
Total Protein: 6.6 g/dL (ref 6.0–8.3)

## 2011-08-14 NOTE — Assessment & Plan Note (Signed)
Td 2007 Cscope x 2 ,  06-2005 (in [facility]) they found polyps; repeated Cscope 2008 normal.  next 5 years  (per patient ), she is aware is due this year, has not been contacted by GI yet cont healthy life style, rec to stay active even during the winter  female care per gyn-- Dr Elson Areas

## 2011-08-14 NOTE — Progress Notes (Signed)
  Subjective:    Patient ID: Rachel Maynard, female    DOB: 14-Jun-1961, 51 y.o.   MRN: [id number]  HPI CPX Doing great  Past Medical History: Hypothyroidism h/o colon polyps  BCC, s/p MOHs 2009 DOE- CP, 3-10 normal stress  ECHO, 4-10 CT neg for PE, saw pulmonary H/O genital herpes   Past Surgical History: Appendectomy Caesarean section Cholecystectomy uterine ablation  Family History: DM-- aunts CAD--no A fib-- mother  stroke -- M age 51 dementia (early)-- mother depression--father Breast ca-G-aunt Ovarian Ca--aunts colon ca--distant relative (F uncle) polyps, colon--father  Social History: Single, 1 child, boy 65 y/o  tobacco--no ETOH-- rarely  Occupation:finances (sedentary) runner in the summer  diet-- good   Review of Systems  Constitutional: Negative for fever and unexpected weight change.  HENT:       Sinus congestion on-off x few months; currently w/ clear-yellowish pnd on-off  Respiratory: Negative for cough and shortness of breath.   Cardiovascular: Negative for chest pain and leg swelling.  Gastrointestinal: Negative for abdominal pain and blood in stool.  Psychiatric/Behavioral:       No depression or anxiety        Objective:   Physical Exam  Constitutional: She is oriented to person, place, and time. She appears well-developed and well-nourished. No distress.  HENT:  Head: Normocephalic and atraumatic.  Neck: No thyromegaly present.  Cardiovascular: Normal rate, regular rhythm and normal heart sounds.   No murmur heard. Pulmonary/Chest: Effort normal and breath sounds normal. No respiratory distress. She has no wheezes. She has no rales.  Abdominal: Soft. She exhibits no distension. There is no tenderness. There is no rebound and no guarding.  Musculoskeletal: She exhibits no edema.  Neurological: She is alert and oriented to person, place, and time.  Skin: Skin is warm and dry. She is not diaphoretic.  Psychiatric: She has a normal mood and  affect. Her behavior is normal. Judgment and thought content normal.      Assessment & Plan:

## 2011-08-15 ENCOUNTER — Encounter: Payer: Self-pay | Admitting: *Deleted

## 2011-10-31 ENCOUNTER — Other Ambulatory Visit: Payer: Self-pay | Admitting: Internal Medicine

## 2011-10-31 NOTE — Telephone Encounter (Signed)
Refill done.  

## 2012-02-05 ENCOUNTER — Telehealth: Payer: Self-pay | Admitting: Internal Medicine

## 2012-02-05 NOTE — Telephone Encounter (Signed)
Pt advise that this is nothing that she needs to be tested for unless she is symptomatic(recent unexplained break or fracture of the bones). Pt also advise per recent labs there is no indication to further testing for DM or glucose because her values have been in range but if she is symptomatic we can have her come in for OV to discuss and further testing can be done at that time. Pt Ok info and verbalized understanding.

## 2012-02-05 NOTE — Telephone Encounter (Signed)
Patient calling, her sister has been dx with Osteoporosis and hypoglycemia.  Does she need to be tested for this?  Her next physical isn't scheduled until next year, 2014.

## 2012-02-05 NOTE — Telephone Encounter (Signed)
Discuss with patient  

## 2012-02-05 NOTE — Telephone Encounter (Signed)
She should have a baseline bone mineral density once she is postmenopausal and every 25 months thereafter if there is any bone loss

## 2012-07-04 ENCOUNTER — Other Ambulatory Visit: Payer: Self-pay | Admitting: Internal Medicine

## 2012-07-06 ENCOUNTER — Encounter: Payer: Self-pay | Admitting: *Deleted

## 2012-07-06 NOTE — Telephone Encounter (Signed)
Refill done.  Letter mailed to pt to notify her she is due for an OV.

## 2012-09-01 ENCOUNTER — Encounter: Payer: Self-pay | Admitting: Internal Medicine

## 2012-09-01 ENCOUNTER — Ambulatory Visit (INDEPENDENT_AMBULATORY_CARE_PROVIDER_SITE_OTHER): Payer: PRIVATE HEALTH INSURANCE | Admitting: Internal Medicine

## 2012-09-01 VITALS — BP 118/68 | HR 71 | Temp 97.9°F | Ht 64.0 in | Wt 151.0 lb

## 2012-09-01 DIAGNOSIS — A6000 Herpesviral infection of urogenital system, unspecified: Secondary | ICD-10-CM

## 2012-09-01 DIAGNOSIS — M79661 Pain in right lower leg: Secondary | ICD-10-CM

## 2012-09-01 DIAGNOSIS — Z0000 Encounter for general adult medical examination without abnormal findings: Secondary | ICD-10-CM

## 2012-09-01 LAB — COMPREHENSIVE METABOLIC PANEL
Albumin: 4 g/dL (ref 3.5–5.2)
CO2: 25 mEq/L (ref 19–32)
GFR: 95.54 mL/min (ref >60.00)
Glucose, Bld: 73 mg/dL (ref 70–99)
Potassium: 3.8 mEq/L (ref 3.5–5.1)
Sodium: 138 mEq/L (ref 135–145)
Total Protein: 6.9 g/dL (ref 6.0–8.3)

## 2012-09-01 LAB — CBC WITH DIFFERENTIAL/PLATELET
Basophils Absolute: 0 10*3/uL (ref 0.0–0.1)
Eosinophils Relative: 2.8 % (ref 0.0–5.0)
MCV: 97.4 fl (ref 78.0–100.0)
Monocytes Absolute: 0.3 10*3/uL (ref 0.1–1.0)
Monocytes Relative: 5.9 % (ref 3.0–12.0)
Neutrophils Relative %: 61.7 % (ref 43.0–77.0)
Platelets: 205 10*3/uL (ref 150.0–400.0)
RDW: 12.2 % (ref 11.5–14.6)
WBC: 5.7 10*3/uL (ref 4.5–10.5)

## 2012-09-01 LAB — LIPID PANEL
Cholesterol: 178 mg/dL (ref 0–200)
LDL Cholesterol: 101 mg/dL — ABNORMAL HIGH (ref 0–99)
Triglycerides: 94 mg/dL (ref 0.0–149.0)

## 2012-09-01 NOTE — Progress Notes (Signed)
  Subjective:    Patient ID: Rachel Maynard, female    DOB: [birth year], 52 y.o.   MRN: [id number]  HPI CPX  Past Medical History: Hypothyroidism h/o colon polyps   BCC, s/p MOHs 2009 DOE- CP, 3-10 normal stress  ECHO, 4-10 CT neg for PE, saw pulmonary H/O genital herpes   Past Surgical History: Appendectomy Caesarean section Cholecystectomy uterine ablation  Family History: DM-- aunts CAD--no A fib-- mother   stroke -- M age 34 dementia (early)-- mother depression--father Breast ca-G-aunt Ovarian Ca--aunts colon ca--distant relative (F uncle) polyps, colon--father Lymphoma-- aunt, uncle  Social History: Single, 1 child, boy 78 y/o   tobacco--no ETOH-- rarely   Occupation:finances (sedentary) Unable to run much, had a calf injury 2013 diet-- good     Review of Systems Since last year, she is doing well.  Stress has increased, her mom is living with her, she has dementia; denies actual depression or anxiety. She is still unable to run, had a calf injury. Denies chest pain, shortness or breath. No nausea, vomiting, diarrhea. Good medication compliance    Objective:   Physical Exam  General -- alert, well-developed, BMI 25 .   Neck --no thyromegaly Lungs -- normal respiratory effort, no intercostal retractions, no accessory muscle use, and normal breath sounds.   Heart-- normal rate, regular rhythm, no murmur, and no gallop.   Abdomen--soft, non-tender, no distention, no masses, no HSM, no guarding, and no rigidity.   Extremities-- no pretibial edema bilaterally  Neurologic-- alert & oriented X3 and strength normal in all extremities. Psych-- Cognition and judgment appear intact. Alert and cooperative with normal attention span and concentration.  not anxious appearing and not depressed appearing.      Assessment & Plan:

## 2012-09-01 NOTE — Assessment & Plan Note (Signed)
Td 2007 Cscope x 2 ,  06-2005 (in [facility]) they found polyps; repeated Cscope 2008 normal.   Last cscope 02-2012, (-), next  52 years  female care per gyn-- Dr Elson Areas Diet-exercise discussed Calf pain, unable to run, refer to Dr fields

## 2012-09-01 NOTE — Assessment & Plan Note (Signed)
On Valtrex daily, few months ago she tried to take it when necessary and didn't work. Plan -- continue with Valtrex daily

## 2012-09-02 ENCOUNTER — Encounter: Payer: Self-pay | Admitting: Internal Medicine

## 2012-09-17 ENCOUNTER — Other Ambulatory Visit: Payer: Self-pay | Admitting: Internal Medicine

## 2012-09-18 NOTE — Telephone Encounter (Signed)
Refill done.  

## 2012-10-21 ENCOUNTER — Encounter: Payer: Self-pay | Admitting: Sports Medicine

## 2012-10-21 ENCOUNTER — Ambulatory Visit (INDEPENDENT_AMBULATORY_CARE_PROVIDER_SITE_OTHER): Payer: Managed Care, Other (non HMO) | Admitting: Sports Medicine

## 2012-10-21 VITALS — BP 106/70 | Ht 64.0 in | Wt 150.0 lb

## 2012-10-21 DIAGNOSIS — M79669 Pain in unspecified lower leg: Secondary | ICD-10-CM | POA: Insufficient documentation

## 2012-10-21 DIAGNOSIS — M79661 Pain in right lower leg: Secondary | ICD-10-CM

## 2012-10-21 DIAGNOSIS — M79609 Pain in unspecified limb: Secondary | ICD-10-CM

## 2012-10-21 MED ORDER — NITROGLYCERIN 0.2 MG/HR TD PT24: MEDICATED_PATCH | TRANSDERMAL | Status: DC

## 2012-10-21 NOTE — Patient Instructions (Addendum)
Very nice to meet you I want you to try these exercises daily.  Wear the compression sleeve with activity and 1 hour after activity.  Wear the sports insoles to help decrease stress on the calf.  Nitroglycerin patch.  1/4 patch in the area daily. You may get a headache and change position daily in three different places.  Try these interventions and come back in 6 weeks.

## 2012-10-21 NOTE — Progress Notes (Signed)
Consult note: The patient was referred by Dr. Drue Novel  HPI: Patient is a very pleasant 51 year old female coming in with right calf pain for 15 month duration. The patient did have a calf tear at that time when doing a boot camp to its course and was trying to push off against resistance. Patient states that she had severe pain at that time and had significant swelling with bruising. Patient of the course of time, multiple months did improve in pain with her regular activities of daily living but she was unable to ever regain strength and function to allow her to jog. Patient used to be an avid runner and was like to continue that if possible. Patient has been taking ibuprofen which has been minimally beneficial. Patient was frustrated she feels like it has not improved much. Patient states that the pain is worse when she runs and calls it more of a sharp ripping sensation she feels. Patient denies any swelling, any redness or erythema, any fevers or chills or any numbness in the area. Patient states that if she does try to wear heels for the entire day she came in to have pain that is severe enough that does wake her up at night.  Past medical history significant for hypothyroidism  No past surgical history on file.  No Known Allergies  History  Substance Use Topics  . Smoking status: Never Smoker   . Smokeless tobacco: Not on file  . Alcohol Use: Yes     Comment: Rare   patient does work as an Airline pilot  Family history significant for hypertension in her mother.  Physical exam Blood pressure 106/70, height 5\' 4"  (1.626 m), weight 150 lb (68.04 kg). General: No apparent distress alert and oriented x3 mood and affect normal Respiratory: Patient's speak in full sentences and does not appear short of breath Skin: Warm dry intact with no signs of infection or rash Neuro: Cranial nerves II through XII are intact, neurovascularly intact in all extremities with 2+ DTRs and 2+ pulses. Right Exam: On  inspection there is no gross deformity. The patient does measure 35 cm which is symmetric 30 cm from the calcaneus.  The patient is nontender on exam. Patient has full strength of the knee, ankle and with plantar flexion. Deep tendon reflex are intact. She is neurovascularly intact distally. Foot exam does show the patient has a cavus foot but otherwise fairly unremarkable. Gait analysis showed a patient as a midfoot stretcher with small amount of external rotation of the right leg. Patient also does not push off on the right side with plantar flexion on push off.  Musculoskeletal ultrasound was performed and interpreted by me today. Patient's ultrasound did show that she had a hypoechoic change in the soleus muscle that appears to be a chronic hematoma. This area did have small amount of neovascularization. In addition this patient did have a small tear appreciated with hypoechoic changes of the lateral gastroc head near the insertion onto the Achilles tendon.

## 2012-10-21 NOTE — Assessment & Plan Note (Signed)
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 With the nitroglycerin quarter patch daily warned of the potential side effects Compression sleeve given. Sports insoles made with scaphoid pads and heel lifts to decrease the amount of strain on the catheter. Patient given home exercise program as well. Patient will return again in 6 weeks for further evaluation.

## 2012-10-21 NOTE — Addendum Note (Signed)
Addended by: Judi Saa on: 10/21/2012 11:29 AM   Modules accepted: Level of Service

## 2012-12-01 ENCOUNTER — Ambulatory Visit (INDEPENDENT_AMBULATORY_CARE_PROVIDER_SITE_OTHER): Payer: Managed Care, Other (non HMO) | Admitting: Internal Medicine

## 2012-12-01 VITALS — BP 118/82 | HR 64 | Temp 98.1°F | Wt 152.0 lb

## 2012-12-01 DIAGNOSIS — J069 Acute upper respiratory infection, unspecified: Secondary | ICD-10-CM

## 2012-12-01 MED ORDER — HYDROCODONE-HOMATROPINE 5-1.5 MG/5ML PO SYRP: 5.0000 mL | ORAL_SOLUTION | Freq: Four times a day (QID) | ORAL | Status: DC | PRN

## 2012-12-01 NOTE — Progress Notes (Signed)
  Subjective:    Patient ID: Rachel Maynard, female    DOB: 04/12/1961, 52 y.o.   MRN: [id number]  HPI Acute visit   URI sx x  2 or 3 days , mild cough in the morning with a small amount of clear and green sputum. Today she coughed more and since then she's hoarse and having moderate sore throat.   Past Medical History: Hypothyroidism h/o colon polyps   BCC, s/p MOHs 2009 DOE- CP, 3-10 normal stress  ECHO, 4-10 CT neg for PE, saw pulmonary H/O genital herpes   Past Surgical History: Appendectomy Caesarean section Cholecystectomy uterine ablation  Family History: DM-- aunts CAD--no A fib-- mother   stroke -- M age 60 dementia (early)-- mother depression--father Breast ca-G-aunt Ovarian Ca--aunts colon ca--distant relative (F uncle) polyps, colon--father Lymphoma-- aunt, uncle  Social History: Single, 1 child, boy 12 y/o   tobacco--no ETOH-- rarely   Occupation:finances (sedentary)    Review of Systems Having runny nose for few weeks, she thinks related to allergies. Sore throat increase with swallowing but no difficulty swallowing per se. No GERD symptoms. Some of her coworkers are sick with URI type of symptoms. No fever, chills. No shortness or breath. No nausea, vomiting, diarrhea.     Objective:   Physical Exam BP 118/82  Pulse 64  Temp(Src) 98.1 F (36.7 C) (Oral)  Wt 152 lb (68.947 kg)  BMI 26.08 kg/m2  SpO2 97%  General -- alert, well-developed,NAD .   Neck --no thyromegaly ,neck symmetric and normal to palpation  HEENT -- TMs normal, throat w/o redness or d/c, tonsils not seen, throat is symmetric; face symmetric and not tender to palpation, nose w/ moderate congestion Lungs -- normal respiratory effort, no intercostal retractions, no accessory muscle use, and normal breath sounds.   Heart-- normal rate, regular rhythm, no murmur, and no gallop.     Psych-- Cognition and judgment appear intact. Alert and cooperative with normal attention span and  concentration.  not anxious appearing and not depressed appearing.       Assessment & Plan:  URI, URI symptoms along with at least moderate sore throat. Strep test negative. Examination of the throat and neck is benign. See instructions, cough control w/ robittusin ( reports unable to take Mucinex DM) and hydrocodone If no better consider abx

## 2012-12-01 NOTE — Patient Instructions (Addendum)
Rest, fluids , tylenol robitussin as needed For persistent-severe cough take hydrocodone, will cause drowsiness Call if no better in few days Call anytime if the symptoms are severe

## 2012-12-02 ENCOUNTER — Ambulatory Visit (INDEPENDENT_AMBULATORY_CARE_PROVIDER_SITE_OTHER): Payer: Managed Care, Other (non HMO) | Admitting: Sports Medicine

## 2012-12-02 ENCOUNTER — Encounter: Payer: Self-pay | Admitting: Internal Medicine

## 2012-12-02 VITALS — BP 113/75 | Ht 64.0 in | Wt 152.0 lb

## 2012-12-02 DIAGNOSIS — M79661 Pain in right lower leg: Secondary | ICD-10-CM

## 2012-12-02 DIAGNOSIS — M79609 Pain in unspecified limb: Secondary | ICD-10-CM

## 2012-12-02 NOTE — Assessment & Plan Note (Signed)
Medial gastroc tear has resolved on [place]  Still needs lots of rehab  Cont plan as noted

## 2012-12-02 NOTE — Progress Notes (Signed)
  Subjective:    Patient ID: Rachel Maynard, female    DOB: 1960/10/06, 52 y.o.   MRN: [id number]  HPI 52 yo female who presents for follow up on right calf pain. Was seen on 10/21/12 for initial evaluation and found to have hematoma in medial soleus and tearing of the gastroc on ultrasound.  Since her last visit, she reports 30% improvement. She has been walking with the compression sleeve. She also has used to nitro patch which she thinks has helped. She has been doing calf raises exercises and has 7/10 pain after 10 reps and does 2 sets. She stops when she feels pain.  She has been able to run up to 1 mile, using her sports insoles with scaphoid pads and heel lifts.   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   Review of Systems Negative except per HPI    Objective:   Physical Exam Filed Vitals:   12/02/12 0836  BP: 113/75   General: pleasant, no acute distress, a+ox4 Right calf: no soft tissue swelling No tenderness with palpation of gastroc and soleus. Normal plantar and dorsiflexion, normal knee flexion and extension. Normal strength.  Dorsalis pedis pulse present  MSK Ultrasound:  Hypoechoic area in the medial soleus and gastroc interface suggesting hematoma has significantly reduced in size since last ultrasound.  Area of tearing in medial gastroc is also significantly reduced.   Area of previous hematoma noted with echoic change improving.     Assessment & Plan:  52 yo female with calf pain likely from chronic soleus hematoma and gastroc tear. Both have significantly reduced in size in the last 6 weeks.  - will work on building up the strength in calf by increasing the total number of reps of heel raises done in 1 day without significant pain - continue compression sleeve with exercises and running - continue nitro patches since patient tolerating medicine without significant side effects - follow up in 6 weeks at which point patient's running gate with inserts  will be re-assessed.

## 2012-12-02 NOTE — Patient Instructions (Addendum)
Build up calf raises: don't go over 10 if mild pain: 5/10 Accumulate about 40 reps with full body weight Do the reps with straight leg and then knee bent at 20 degrees Use the compression sleeve for the exercises and running Follow up in 6 weeks and bring your running shoes!

## 2012-12-03 ENCOUNTER — Telehealth: Payer: Self-pay | Admitting: Internal Medicine

## 2012-12-03 MED ORDER — AMOXICILLIN 500 MG PO CAPS: 1000.0000 mg | ORAL_CAPSULE | Freq: Two times a day (BID) | ORAL | Status: DC

## 2012-12-03 MED ORDER — HYDROCODONE-HOMATROPINE 5-1.5 MG/5ML PO SYRP: 5.0000 mL | ORAL_SOLUTION | Freq: Four times a day (QID) | ORAL | Status: DC | PRN

## 2012-12-03 NOTE — Telephone Encounter (Signed)
call amoxicillin 500 mg 2 po bid #40 Use hydrocodone for cough If cough persist may try tessalon Perles 100 mg 1 or 2 po tid prn #40, no RF

## 2012-12-03 NOTE — Telephone Encounter (Signed)
Caller wanted to verify if a medication was going to be sent in for her. She had spoken with [facility] and nothing had been received. Caller will only be at work until 1420-1500 today due to the cough and would like to be able to pick up medication from pharmacy on her way home. RN informed caller medication was ordered. PLEASE VERIFY IF MEDICATION WAS CALLED IN. NO NOTATION IN EPIC AT THIS TIME.

## 2012-12-03 NOTE — Telephone Encounter (Signed)
Patient was in on Tuesday (12/01/12) for her cough and states that she is not feeling any better. She would like to know if Dr. Drue Novel could prescribe her something.

## 2012-12-03 NOTE — Telephone Encounter (Signed)
Rx sent to pharmacy, pt made aware.

## 2012-12-03 NOTE — Telephone Encounter (Signed)
Pt still c/o cough now with greenish/ red spotted mucous and increased coughing episode to the point she is being awaken at night. Pt also with facial pain, drainage, and  sore throat  Pt uses [facility]

## 2012-12-07 ENCOUNTER — Telehealth: Payer: Self-pay | Admitting: Internal Medicine

## 2012-12-07 MED ORDER — AZITHROMYCIN 250 MG PO TABS: ORAL_TABLET | ORAL | Status: DC

## 2012-12-07 NOTE — Telephone Encounter (Signed)
Call in a Z-Pak, discontinue amoxicillin

## 2012-12-07 NOTE — Telephone Encounter (Signed)
Pt made aware abx was sent to pharmacy.

## 2012-12-07 NOTE — Telephone Encounter (Signed)
Patient Information:  Caller Name: Gessica  Phone: [phone]  Patient: Rachel Maynard, Rachel Maynard  Gender: Female  DOB: 04-21-1961  Age: 52 Years  PCP: Willow Ora  Pregnant: No  Office Follow Up:  Does the office need to follow up with this patient?: Yes  Instructions For The Office: Caller would like a stronger antibiotic- states Amoxicillin is not helping  RN Note:  Caller states Amoxicillin is not helping sinus infection. Warm cloth compress to eye helped open eye and remove drainage. Cough has continued and drainage from nose has continued to be dark green. Eye has not conitinued to drain this morning. Caller states this happens when antibiotic is not strong enough. Caller is at work. Decreased appetite. Caller is drinking, voiding, and interacting within normal limits per caller. Pharamcy: [facility]  Symptoms  Reason For Call & Symptoms: left eye swollen shut with yellow green drainage  Reviewed Health History In EMR: Yes  Reviewed Medications In EMR: Yes  Reviewed Allergies In EMR: Yes  Reviewed Surgeries / Procedures: Yes  Date of Onset of Symptoms: 12/07/2012 OB / GYN:  LMP: Unknown  Guideline(s) Used:  Eye - Pus or Discharge  Disposition Per Guideline:   Home Care  Reason For Disposition Reached:   Very small amount of discharge and only in corner of eye  Advice Given:  Reassurance:  A small amount of pus or mucus in the inner corner of the eye is often due to a cold or virus. Sometimes it's just a reaction to an irritant in the eye.  You can get pink eye from someone who has had it recently.  Pink eye will not harm your eyesight.  Viral conjunctivitis (pink eye) does not need antibiotic treatment. It gets better by itself. Usually it's gone in 2 or 3 days.  Here is some care advice that should help.  Eyelid Cleansing:   Gently wash eyelids and lashes with warm water and wet cotton balls (or cotton gauze). Remove all the dried and liquid pus.  Do  this as often as needed.  Contagiousness:  [place]e is contagious. It is very easy to spread to other people. You can spread it by shaking hands.  Try not to touch your eyes. Wash your hands often. Do not share towels. Try not to touch your eyes. Wash your hands frequently. Do not share towels.  You may return to work or school. Avoid physical contact (e.g., shaking hands) until the symptoms have resolved.  Call Back If  Pus increases in amount  Pus in corner of eye lasts over 3 days  Eyelid becomes red or swollen  You become worse.  Patient Refused Recommendation:  Patient Requests Prescription  Caller would like a stonger antibiotic

## 2012-12-07 NOTE — Telephone Encounter (Signed)
Please advise 

## 2012-12-10 ENCOUNTER — Encounter: Payer: PRIVATE HEALTH INSURANCE | Admitting: Internal Medicine

## 2013-01-14 ENCOUNTER — Ambulatory Visit (INDEPENDENT_AMBULATORY_CARE_PROVIDER_SITE_OTHER): Payer: Managed Care, Other (non HMO) | Admitting: Sports Medicine

## 2013-01-14 VITALS — BP 124/77 | Ht 64.0 in | Wt 155.0 lb

## 2013-01-14 DIAGNOSIS — M79609 Pain in unspecified limb: Secondary | ICD-10-CM

## 2013-01-14 DIAGNOSIS — M79661 Pain in right lower leg: Secondary | ICD-10-CM

## 2013-01-14 NOTE — Assessment & Plan Note (Signed)
Excellent progress by exam and [place]  Tear is largely healed  rescan on 6 mos check up

## 2013-01-14 NOTE — Progress Notes (Signed)
Patient ID: Rachel Maynard, female   DOB: [birth year], 52 y.o.   MRN: [id number] Is a 52 year old female who presents for routine follow up of a right medial gastrocnemius and soleus tear in 10/21/2012. Today she reports improvement in symptoms and continued healing. She's currently using nitroglycerin patch with no current complications. She's been doing her eccentric Raising exercises she is up to 3 sets of 10 repetitions. She is also running 2 miles currently without significant pain. Any longer than that and she does report some pain. She also continues to use the compression sleeve. Overall she reports a large improvement in her symptoms.  Examination BP 124/77  Ht 5\' 4"  (1.626 m)  Wt 155 lb (70.308 kg)  BMI 26.59 kg/m2 This is a 52 year old female in no acute distress  Right calf reveals no tenderness on exam  normal Thompson's test 5 out of 5 plantar and dorsiflexion of the right Neurovascularly intact distally  Muscle skeletal ultrasound of the right gastrocnemius and soleus 95% healing evident by ultrasound with muscle remodeling noted There is evidence of organized tissue where hypoechoic change existed prior Only mild change in doppler flow  Impression 3 months status post gastrocnemius and soleus tear  Plan Continue eccentric exercises and using nitroglycerin patch and followup in 3 months

## 2013-03-03 ENCOUNTER — Ambulatory Visit: Payer: Managed Care, Other (non HMO) | Admitting: Sports Medicine

## 2013-04-08 ENCOUNTER — Ambulatory Visit (INDEPENDENT_AMBULATORY_CARE_PROVIDER_SITE_OTHER): Payer: Managed Care, Other (non HMO) | Admitting: Sports Medicine

## 2013-04-08 ENCOUNTER — Encounter: Payer: Self-pay | Admitting: Sports Medicine

## 2013-04-08 VITALS — BP 116/73 | HR 69 | Ht 64.0 in | Wt 155.0 lb

## 2013-04-08 DIAGNOSIS — S86112S Strain of other muscle(s) and tendon(s) of posterior muscle group at lower leg level, left leg, sequela: Secondary | ICD-10-CM

## 2013-04-08 DIAGNOSIS — S86119A Strain of other muscle(s) and tendon(s) of posterior muscle group at lower leg level, unspecified leg, initial encounter: Secondary | ICD-10-CM | POA: Insufficient documentation

## 2013-04-08 DIAGNOSIS — IMO0002 Reserved for concepts with insufficient information to code with codable children: Secondary | ICD-10-CM

## 2013-04-08 NOTE — Progress Notes (Signed)
Patient ID: Rachel Maynard, female   DOB: 1960-07-20, 52 y.o.   MRN: [id number] 52 year old female presents for three-month followup visit from her right medial gastroc tear which occurred approximately 6 months ago. Overall she is doing well. She continues topical nitroglycerin patch therapy. She was running upwards of 6-8 miles per week when she felt a slight twinge in her calf shoulder pain. This quickly resolved. She stopped running since that time. She continues to do ecentric toe raise exercises. Tolerating nitroglycerin therapy well however, she takes the patch off at night secondary to awakening with a headache. Currently without pain.   Review of systems as per history of present illness otherwise negative   Examination:  Well-developed well-nourished 52 year old white female awake alert oriented in no acute distress  BP 116/73  Pulse 69  Ht 5\' 4"  (1.626 m)  Wt 155 lb (70.308 kg)  BMI 26.59 kg/m2  Examination of the right calf  No tenderness to palpation  Range of motion intact  Normal Thompson test  No ecchymosis or swelling.  5 over 5 plantar flexion strength  Neurovascularly intact with equal pulses in bilateral lower extremities.   MSK ultrasound of the right gastrocnemius muscle and soleus performed. Images obtained transverse and longitudinal show near complete improvement of the medial head of gastroc muscle tear. There is evidence of extra muscle belly deep to the medial head of the gastroc between the gastroc and soleus muscle. This may represent plantaris muscle belly or something anomalous muscle belly. There is a small amount of fluid between this and the medial head of the gastroc distally with some evidence of remodeling of the distal portion of the medial head of gastroc. Overall there is significant improvement in the muscle tear.

## 2013-04-08 NOTE — Assessment & Plan Note (Signed)
Near-complete resolution of gastric care today. Patient will continue nitroglycerin therapy for 3 more months she'll followup at that time. She continues to use a heel lift. She also continued eccentric exercises. Caution and slow transplant of running program.

## 2013-07-01 ENCOUNTER — Encounter: Payer: Self-pay | Admitting: Sports Medicine

## 2013-07-01 ENCOUNTER — Ambulatory Visit (INDEPENDENT_AMBULATORY_CARE_PROVIDER_SITE_OTHER): Payer: Managed Care, Other (non HMO) | Admitting: Sports Medicine

## 2013-07-01 VITALS — BP 118/80 | Ht 64.0 in | Wt 160.0 lb

## 2013-07-01 DIAGNOSIS — M79609 Pain in unspecified limb: Secondary | ICD-10-CM

## 2013-07-01 DIAGNOSIS — M79661 Pain in right lower leg: Secondary | ICD-10-CM

## 2013-07-01 DIAGNOSIS — S86111D Strain of other muscle(s) and tendon(s) of posterior muscle group at lower leg level, right leg, subsequent encounter: Secondary | ICD-10-CM

## 2013-07-01 DIAGNOSIS — Z5189 Encounter for other specified aftercare: Secondary | ICD-10-CM

## 2013-07-01 NOTE — Assessment & Plan Note (Signed)
Pain has resolved with the exception of vigorous activity

## 2013-07-01 NOTE — Assessment & Plan Note (Signed)
She is given a series of exercises to continue.  I suggested a program to ease back into running. Clinical and ultrasound examination look to be completely healed.  This injury shows a high risk for recurrence continue using a heel lift and a compression sleeve.  She can recheck with me as needed.

## 2013-07-01 NOTE — Progress Notes (Signed)
Patient ID: Rachel Maynard, female   DOB: [birth year], 53 y.o.   MRN: [id number]  Patient returns now 9 months after a medial gastrocnemius tear. She has been active and has returned to fairly vigorous walking. She has had occasional sharp calf pain primarily after she tried to do a workout walking up about 12-15 flights of stairs.  No swelling. No discoloration. She has not restarted running.  Heel lift does feel helpful that is added to her sports insoles but she's not using this and other shoes.  Nitroglycerin really helped get rid of her pain and she continued using for a total of approximately 7-8 months but now has stopped.  She does get calf tightness if she does not wear the compression sleeve  Physical examination No acute distress BP 118/80  Ht 5\' 4"  (1.626 m)  Wt 160 lb (72.576 kg)  BMI 27.45 kg/m2  Right calf is completely nontender Definition of right calf muscles versus left calf muscle looked equal She can do multiple 1 leg and heel raises on a step without significant pain  MSK ultrasound reveals that the area of previous hypoechoic change along the medial gastrocnemius has resolved. There is some poor definition of distal muscle fibers that think represents scar tissue. 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 I believe the tear was in the fascia between the plantaris and the medial gastroc. This muscle is less hypoechoic today.

## 2013-07-01 NOTE — Patient Instructions (Signed)
Keep up your exercises a minimum of 3x per week  OK to ease into some running on flat/ walk if you feel tightness for 2 to 3 mins and start back slowly  When you start - limit to 20 mins and add 5 mins per week/ walk run until you have done this for 4 weeks  Keep using compression  Keep using heel lifts  See me as needed

## 2013-07-20 ENCOUNTER — Telehealth: Payer: Self-pay | Admitting: Internal Medicine

## 2013-07-20 NOTE — Telephone Encounter (Signed)
Ok to schedule.

## 2013-07-20 NOTE — Telephone Encounter (Signed)
Arrange for a TSH, DX hypothyroidism

## 2013-07-20 NOTE — Telephone Encounter (Signed)
Patient believes her thyroid level is off and would like to come in for [facility] labs. She has CPE scheduled for March, but she feels like she needs this part sooner. Please advise.  [phone]

## 2013-07-21 ENCOUNTER — Other Ambulatory Visit: Payer: Self-pay | Admitting: *Deleted

## 2013-07-21 DIAGNOSIS — E039 Hypothyroidism, unspecified: Secondary | ICD-10-CM

## 2013-07-21 NOTE — Telephone Encounter (Signed)
Called and left message for patient to call back and schedule lab appt. TSH entered into Epic. JG//CMA

## 2013-07-22 ENCOUNTER — Other Ambulatory Visit (INDEPENDENT_AMBULATORY_CARE_PROVIDER_SITE_OTHER): Payer: Managed Care, Other (non HMO)

## 2013-07-22 DIAGNOSIS — E039 Hypothyroidism, unspecified: Secondary | ICD-10-CM

## 2013-07-22 LAB — TSH: TSH: 2.27 u[IU]/mL (ref 0.35–5.50)

## 2013-07-23 LAB — HM MAMMOGRAPHY: HM Mammogram: NEGATIVE

## 2013-07-26 ENCOUNTER — Encounter: Payer: Self-pay | Admitting: Internal Medicine

## 2013-08-25 ENCOUNTER — Ambulatory Visit (INDEPENDENT_AMBULATORY_CARE_PROVIDER_SITE_OTHER): Payer: Managed Care, Other (non HMO) | Admitting: Emergency Medicine

## 2013-08-25 ENCOUNTER — Encounter: Payer: Self-pay | Admitting: Emergency Medicine

## 2013-08-25 VITALS — BP 117/81 | Ht 63.5 in | Wt 160.0 lb

## 2013-08-25 DIAGNOSIS — M25569 Pain in unspecified knee: Secondary | ICD-10-CM

## 2013-08-25 DIAGNOSIS — M25561 Pain in right knee: Secondary | ICD-10-CM

## 2013-08-25 DIAGNOSIS — M222X9 Patellofemoral disorders, unspecified knee: Secondary | ICD-10-CM | POA: Insufficient documentation

## 2013-08-25 NOTE — Progress Notes (Signed)
Patient ID: Hani Patnode, female   DOB: May 22, 1961, 53 y.o.   MRN: [id number] 53 year old female with a complaint of right anterior knee pain. Does a lot of creaking or cracking when walking upstairs. No pain walking downstairs no noise walking downstairs. Asymptomatic when walking on flat ground as well. Recently recovered from a calf tear and has started increasing activity recently. Denies any significant swelling to the knee. Pain is in the superior portion of her knee cap. No specific injury.  Pertinent past medical history: Gastrocnemius tear, hypothyroidism  Review of systems as per history of present illness otherwise all systems negative  Examination:  BP 117/81  Ht 5' 3.5" (1.613 m)  Wt 160 lb (72.576 kg)  BMI 27.89 kg/m2 Well-developed well-nourished 53 year old white female awake alert and oriented no acute distress Right Knee: Normal to inspection with no erythema or effusion or obvious bony abnormalities. Palpation normal with no warmth or joint line tenderness or condyle tenderness. ROM normal in flexion and extension and lower leg rotation. Ligaments with solid consistent endpoints including ACL, PCL, LCL, MCL. Negative Mcmurray's and provocative meniscal tests. Patella crepitus noted with extension. Hamstring and quadriceps strength is normal.

## 2013-08-25 NOTE — Assessment & Plan Note (Signed)
X-rays of her knee including sunrise view were ordered today. This is to rule out bony spurring or significant chondromalacia patella. She was also given a patellar strap to help alleviate symptoms. We will discuss the x-ray results and determine followup after that.

## 2013-08-26 ENCOUNTER — Other Ambulatory Visit: Payer: Self-pay | Admitting: Emergency Medicine

## 2013-08-26 ENCOUNTER — Ambulatory Visit (HOSPITAL_BASED_OUTPATIENT_CLINIC_OR_DEPARTMENT_OTHER)
Admission: RE | Admit: 2013-08-26 | Discharge: 2013-08-26 | Disposition: A | Discharge status: Home / Self Care | Payer: Managed Care, Other (non HMO) | Source: Ambulatory Visit | Attending: Emergency Medicine | Admitting: Emergency Medicine

## 2013-08-26 DIAGNOSIS — M25569 Pain in unspecified knee: Secondary | ICD-10-CM | POA: Insufficient documentation

## 2013-08-26 DIAGNOSIS — M25561 Pain in right knee: Secondary | ICD-10-CM

## 2013-09-15 ENCOUNTER — Telehealth: Payer: Self-pay

## 2013-09-15 NOTE — Telephone Encounter (Signed)
Medication and allergies:  Reviewed and updated  90 day supply/mail order: [facility] Local pharmacy: [facility] [place]   Immunizations due:  Unsure on last Tdap  A/P:   No changes to FH, PSH or Personal Hx Flu vaccine--04/2013 Tdap--unsure Pap--07/2011--neg MMG--07/2013--neg Bone Density-- CCS--2013--polyp history--next due--2018  To Discuss with Provider: Gyn has left practicing. Needs new one.

## 2013-09-16 ENCOUNTER — Ambulatory Visit (INDEPENDENT_AMBULATORY_CARE_PROVIDER_SITE_OTHER): Payer: 59 | Admitting: Internal Medicine

## 2013-09-16 ENCOUNTER — Encounter: Payer: Self-pay | Admitting: Internal Medicine

## 2013-09-16 VITALS — BP 110/73 | HR 69 | Temp 98.0°F | Ht 65.0 in | Wt 161.0 lb

## 2013-09-16 DIAGNOSIS — Z0000 Encounter for general adult medical examination without abnormal findings: Secondary | ICD-10-CM

## 2013-09-16 LAB — COMPREHENSIVE METABOLIC PANEL
ALK PHOS: 49 U/L (ref 39–117)
ALT: 16 U/L (ref 0–35)
AST: 20 U/L (ref 0–37)
Albumin: 4.1 g/dL (ref 3.5–5.2)
BILIRUBIN TOTAL: 1.4 mg/dL — AB (ref 0.3–1.2)
BUN: 14 mg/dL (ref 6–23)
CO2: 26 mEq/L (ref 19–32)
Calcium: 9 mg/dL (ref 8.4–10.5)
Chloride: 105 mEq/L (ref 96–112)
Creatinine, Ser: 0.7 mg/dL (ref 0.4–1.2)
GFR: 87.77 mL/min (ref >60.00)
GLUCOSE: 75 mg/dL (ref 70–99)
Potassium: 4 mEq/L (ref 3.5–5.1)
Sodium: 138 mEq/L (ref 135–145)
Total Protein: 6.8 g/dL (ref 6.0–8.3)

## 2013-09-16 LAB — LIPID PANEL
CHOLESTEROL: 164 mg/dL (ref 0–200)
HDL: 56.3 mg/dL (ref >39.00)
LDL CALC: 85 mg/dL (ref 0–99)
Total CHOL/HDL Ratio: 3
Triglycerides: 114 mg/dL (ref 0.0–149.0)
VLDL: 22.8 mg/dL (ref 0.0–40.0)

## 2013-09-16 LAB — TSH: TSH: 1.48 u[IU]/mL (ref 0.35–5.50)

## 2013-09-16 LAB — CBC WITH DIFFERENTIAL/PLATELET
BASOS PCT: 0.6 % (ref 0.0–3.0)
Basophils Absolute: 0 10*3/uL (ref 0.0–0.1)
EOS PCT: 2.8 % (ref 0.0–5.0)
Eosinophils Absolute: 0.2 10*3/uL (ref 0.0–0.7)
HEMATOCRIT: 37.1 % (ref 36.0–46.0)
HEMOGLOBIN: 12.7 g/dL (ref 12.0–15.0)
LYMPHS ABS: 2 10*3/uL (ref 0.7–4.0)
Lymphocytes Relative: 28.9 % (ref 12.0–46.0)
MCHC: 34.4 g/dL (ref 30.0–36.0)
MCV: 94.4 fl (ref 78.0–100.0)
MONO ABS: 0.4 10*3/uL (ref 0.1–1.0)
Monocytes Relative: 6.4 % (ref 3.0–12.0)
NEUTROS ABS: 4.2 10*3/uL (ref 1.4–7.7)
Neutrophils Relative %: 61.3 % (ref 43.0–77.0)
Platelets: 202 10*3/uL (ref 150.0–400.0)
RBC: 3.93 Mil/uL (ref 3.87–5.11)
RDW: 12.4 % (ref 11.5–14.6)
WBC: 6.9 10*3/uL (ref 4.5–10.5)

## 2013-09-16 LAB — HEMOGLOBIN A1C: Hgb A1c MFr Bld: 4.8 % (ref 4.6–6.5)

## 2013-09-16 NOTE — Progress Notes (Signed)
Pre visit review using our clinic review tool, if applicable. No additional management support is needed unless otherwise documented below in the visit note. 

## 2013-09-16 NOTE — Progress Notes (Signed)
Subjective:    Patient ID: Rachel Maynard, female    DOB: 1960/09/23, 53 y.o.   MRN: [id number]  DOS:  09/16/2013 Type of  visit:  CPX   ROS Diet-- healthy most of the time Exercise-- 40 min jogging a week  C/o fatigue, see a/p  No  CP, SOB No palpitations, no lower extremity edema Denies  nausea, vomiting diarrhea denies  blood in the stools (-) cough, sputum production (-) wheezing, chest congestion No dysuria, gross hematuria, difficulty urinating  No anxiety, depression      Past Medical History  Diagnosis Date  . Hypothyroidism   . Colon polyps   . BCC (basal cell carcinoma of skin)      s/p [facility] 2009  . Dyspnea     DOE- CP, 3-10 normal stress  ECHO, 4-10 CT neg for PE, saw pulmonary  . Genital herpes     On Valtrex    Past Surgical History  Procedure Laterality Date  . Appendectomy  1980  . C section  2012  . Cholecystectomy  2004  . Uterine ablation      History   Social History  . Marital Status: Single    Spouse Name: N/A    Number of Children: 1  . Years of Education: N/A   Occupational History  . Finances, sedentary    Social History Main Topics  . Smoking status: Never Smoker   . Smokeless tobacco: Never Used  . Alcohol Use: Yes     Comment: Rare  . Drug Use: No  . Sexual Activity: Not on file   Other Topics Concern  . Not on file   Social History Narrative   Single, 1 child, boy 20 y/o  dx w/ Asperger's               Family History: DM-- aunts CAD--no A fib-- mother   stroke -- M age 51 dementia (early)-- mother depression--father Breast ca-G-aunt Ovarian Ca--aunts colon ca--distant relative (F uncle) polyps, colon--father Lymphoma-- aunt, uncle Osteoporosis-- sister Pre-diabetes-- sister      Medication List       This list is accurate as of: 09/16/13  2:49 PM.  Always use your most recent med list.               BALZIVA 0.4-35 MG-MCG tablet  Generic drug:  norethindrone-ethinyl estradiol  Take 1 tablet by  mouth daily.     EPIPEN 2-PAK 0.3 mg/0.3 mL Soaj injection  Generic drug:  EPINEPHrine  as needed.     fluticasone 50 MCG/ACT nasal spray  Commonly known as:  FLONASE  Place 1 spray into the nose 2 (two) times daily as needed for rhinitis.     levothyroxine 100 MCG tablet  Commonly known as:  SYNTHROID, LEVOTHROID  TAKE 1 TABLET DAILY (OFFICE VISIT DUE NOW)     valACYclovir 500 MG tablet  Commonly known as:  VALTREX  Take 500 mg by mouth daily. As needed , genital herpes           Objective:   Physical Exam BP 110/73  Pulse 69  Temp(Src) 98 F (36.7 C)  Ht 5\' 5"  (1.651 m)  Wt 161 lb (73.029 kg)  BMI 26.79 kg/m2  SpO2 100%  General -- alert, well-developed, NAD.  Neck --no thyromegaly , normal carotid pulse  HEENT-- Not pale.  Lungs -- normal respiratory effort, no intercostal retractions, no accessory muscle use, and normal breath sounds.  Heart-- normal rate, regular rhythm, no  murmur.  Abdomen-- Not distended, good bowel sounds,soft, non-tender. Extremities-- no pretibial edema bilaterally  Neurologic--  alert & oriented X3. Speech normal, gait normal, strength normal in all extremities.  Psych-- Cognition and judgment appear intact. Cooperative with normal attention span and concentration. No anxious or depressed appearing.     Assessment & Plan:

## 2013-09-16 NOTE — Assessment & Plan Note (Addendum)
Td 2007? Gave one today Cscope x 2 ,  06-2005 (in [facility]) they found polyps Cscope 2008 normal.   cscope 02-2012, (-), next  53 years  female care per gyn Pap--07/2011--neg  MMG--07/2013--neg  dexa-- never had Diet-exercise discussed   C/o freq fatigue, denies depression, DOE, sleeps 8 h/night and feels rested. 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 No obvious Etiology for her fatigue. We are checking labs,  Goal TSH 1.0 or a little less Labs including the A1c, sister diagnosed with prediabetes recently

## 2013-09-16 NOTE — Patient Instructions (Signed)
Get your blood work before you leave   Next visit is for routine check up regards your fatigue and thyroid  in 6 months  No need to come back fasting Please make an appointment

## 2013-09-17 ENCOUNTER — Ambulatory Visit (INDEPENDENT_AMBULATORY_CARE_PROVIDER_SITE_OTHER): Payer: 59

## 2013-09-17 DIAGNOSIS — Z23 Encounter for immunization: Secondary | ICD-10-CM

## 2013-09-20 ENCOUNTER — Telehealth: Payer: Self-pay | Admitting: Internal Medicine

## 2013-09-20 MED ORDER — LEVOTHYROXINE SODIUM 112 MCG PO TABS: 112.0000 ug | ORAL_TABLET | Freq: Every day | ORAL | Status: DC

## 2013-09-20 NOTE — Telephone Encounter (Signed)
Done. rx sent e

## 2013-09-20 NOTE — Telephone Encounter (Signed)
Patient called and stated that dr Larose Kells was suppose to send in a new rx for her levothyroxine (SYNTHROID, LEVOTHROID) 100 MCG tablet because he was going to increase it. Also patient would like for it to be sent to [facility] Please advise.

## 2013-10-13 ENCOUNTER — Encounter: Payer: Self-pay | Admitting: Sports Medicine

## 2013-10-13 ENCOUNTER — Ambulatory Visit (INDEPENDENT_AMBULATORY_CARE_PROVIDER_SITE_OTHER): Payer: PRIVATE HEALTH INSURANCE | Admitting: Sports Medicine

## 2013-10-13 VITALS — BP 121/75 | Ht 64.0 in | Wt 160.0 lb

## 2013-10-13 DIAGNOSIS — M222X9 Patellofemoral disorders, unspecified knee: Secondary | ICD-10-CM

## 2013-10-13 DIAGNOSIS — M25569 Pain in unspecified knee: Secondary | ICD-10-CM

## 2013-10-13 MED ORDER — NAPROXEN 500 MG PO TABS: 500.0000 mg | ORAL_TABLET | Freq: Two times a day (BID) | ORAL | Status: DC | PRN

## 2013-10-13 NOTE — Patient Instructions (Signed)
Thank you for coming in today  1. Knee exercises daily 2. Naproxen 2x per day for 2 weeks then as needed 3. Okay to run if tolerated 4. Stationary bike 2x per week for 30 min  Followup 6 weeks

## 2013-10-13 NOTE — Progress Notes (Signed)
CC: Followup right knee pain HPI: Patient presents for followup of right knee pain. She was previously diagnosed with patellofemoral pain by Dr. Meredith Staggers. He obtained x-rays as well as gave her a patellar strap. She has been trying to return to running but notes that she is having more anterior knee pain. She notes cracking and popping when she is going up stairs associated with aching. She also has aching when she is trying to run uphill. She does not find the strap helpful. She is not really getting significant swelling. She has not had any rehabilitation exercises.  X-rays reviewed and were negative for any significant degenerative change. There is very very mild medial joint space narrowing with normal appearing patellofemoral joint  ROS: As above in the HPI. All other systems are stable or negative.  OBJECTIVE: APPEARANCE:  Patient in no acute distress.The patient appeared well nourished and normally developed. HEENT: No scleral icterus. Conjunctiva non-injected Resp: Non labored Skin: No rash MSK:  Right Knee - Inspection normal with no erythema or effusion or obvious bony abnormalities.  - Palpation normal with no warmth or joint line tenderness. No TTP at patellar or quad tendon.  - ROM normal in flexion and extension. - Strength 5/5 in flexion and extension. - Ligaments with solid consistent endpoints including ACL, PCL, LCL, MCL.  - Negative Mcmurray's.  - Non painful patellar compression.  - Neurovascularly intact - Normal walking gait - Hip abduction strength 4+ out of 5 on the left as compared to 5 out of 5 on the right - With step down both forward and laterally she has patellar wobble   MSK [place]: Not performed   ASSESSMENT: Right patellofemoral pain   PLAN: Patient was given a home exercise program including straight leg raise, short ARC quad, step downs both forward and laterally, hip abduction. We will start her on naproxen 500 mg twice a day x2 weeks and then as  needed. Continue strap only if helpful. Followup in 6 weeks.

## 2013-11-04 ENCOUNTER — Other Ambulatory Visit (INDEPENDENT_AMBULATORY_CARE_PROVIDER_SITE_OTHER): Payer: PRIVATE HEALTH INSURANCE

## 2013-11-04 DIAGNOSIS — E039 Hypothyroidism, unspecified: Secondary | ICD-10-CM

## 2013-11-04 LAB — TSH: TSH: 0.71 u[IU]/mL (ref 0.35–4.50)

## 2014-02-03 ENCOUNTER — Ambulatory Visit (INDEPENDENT_AMBULATORY_CARE_PROVIDER_SITE_OTHER): Payer: PRIVATE HEALTH INSURANCE | Admitting: Family Medicine

## 2014-02-03 ENCOUNTER — Encounter: Payer: Self-pay | Admitting: Family Medicine

## 2014-02-03 VITALS — BP 114/76 | HR 68 | Ht 64.0 in | Wt 155.0 lb

## 2014-02-03 DIAGNOSIS — M79609 Pain in unspecified limb: Secondary | ICD-10-CM

## 2014-02-03 DIAGNOSIS — S86819A Strain of other muscle(s) and tendon(s) at lower leg level, unspecified leg, initial encounter: Secondary | ICD-10-CM

## 2014-02-03 DIAGNOSIS — S86811A Strain of other muscle(s) and tendon(s) at lower leg level, right leg, initial encounter: Secondary | ICD-10-CM

## 2014-02-03 DIAGNOSIS — M79661 Pain in right lower leg: Secondary | ICD-10-CM

## 2014-02-03 DIAGNOSIS — S838X9A Sprain of other specified parts of unspecified knee, initial encounter: Secondary | ICD-10-CM

## 2014-02-03 MED ORDER — NITROGLYCERIN 0.2 MG/HR TD PT24: MEDICATED_PATCH | TRANSDERMAL | Status: DC

## 2014-02-03 NOTE — Patient Instructions (Signed)
You have a calf strain Compression sleeve to help with swelling and pain - wear regularly for next 6 weeks. Consider using ace wrap at bedtime (if this is too tight and painful even then don't wear something at bedtime. Heel lifts either in temporary orthotics or on their own to prevent further strain for next 6 weeks. Avoid barefoot walking, flat shoes, inclines/hills. Tylenol and/or aleve for pain. Heel raise exercise when able - start with 2 legs, then 1, then eventually on a step.  3 sets of 10 once a day - wait 1 week before starting these. Cycling with low resistance or swimming for cross training in meantime. Consider getting back to running in about 4-6 weeks. Follow up with me in 6 weeks.

## 2014-02-07 ENCOUNTER — Telehealth: Payer: Self-pay | Admitting: Internal Medicine

## 2014-02-07 ENCOUNTER — Encounter: Payer: Self-pay | Admitting: Family Medicine

## 2014-02-07 MED ORDER — LEVOTHYROXINE SODIUM 112 MCG PO TABS: 112.0000 ug | ORAL_TABLET | Freq: Every day | ORAL | Status: DC

## 2014-02-07 NOTE — Telephone Encounter (Signed)
Spoke with Pt, sent in medication refill to [facility] # [facility] with 0 RF, informed Pt she will need to make appt for any further refills.

## 2014-02-07 NOTE — Telephone Encounter (Signed)
Caller name: Charm  Relation to pt: self  Call back number: [phone] Pharmacy: [facility]   Reason for call: pt would like to know the status of her refill  levothyroxine (SYNTHROID, LEVOTHROID) 112 MCG tablet  [facility] and [facility] her script. Please advise

## 2014-02-07 NOTE — Assessment & Plan Note (Signed)
Continue with calf sleeve.  Heel lifts, reviewed home exercise program as well.  Avoid barefoot walking, flat shoes, inclines as much as possible.  Cross training.  F/u in 6 weeks.

## 2014-02-07 NOTE — Progress Notes (Signed)
Patient ID: Rachel Maynard, female   DOB: 1960/11/30, 53 y.o.   MRN: [id number]  PCP: Kathlene November, MD  Subjective:   HPI: Patient is a 53 y.o. female here for right calf pain.  Patient reports on Sunday she felt a pop in right calf muscle when pushing a wheelchair up a hill. Has improved some since that time. Using a sleeve. No swelling or bruising. Pain worse with stairs and at bedtime. Similar issue remotely that she completely recovered from.  Past Medical History  Diagnosis Date  . Hypothyroidism   . Colon polyps   . BCC (basal cell carcinoma of skin)      s/p [facility] 2009  . Dyspnea     DOE- CP, 3-10 normal stress  ECHO, 4-10 CT neg for PE, saw pulmonary  . Genital herpes     On Valtrex    Current Outpatient Prescriptions on File Prior to Visit  Medication Sig Dispense Refill  . BALZIVA 0.4-35 MG-MCG tablet Take 1 tablet by mouth daily.      Marland Kitchen EPIPEN 2-PAK 0.3 MG/0.3ML SOAJ injection as needed.      . fluticasone (FLONASE) 50 MCG/ACT nasal spray Place 1 spray into the nose 2 (two) times daily as needed for rhinitis.  16 g  2  . levothyroxine (SYNTHROID, LEVOTHROID) 112 MCG tablet Take 1 tablet (112 mcg total) by mouth daily.  30 tablet  3  . naproxen (NAPROSYN) 500 MG tablet Take 1 tablet (500 mg total) by mouth 2 (two) times daily as needed (take 2x per day for 2 weeks then as needed).  60 tablet  0  . valACYclovir (VALTREX) 500 MG tablet Take 500 mg by mouth daily. As needed , genital herpes       No current facility-administered medications on file prior to visit.    Past Surgical History  Procedure Laterality Date  . Appendectomy  1980  . C section  2012  . Cholecystectomy  2004  . Uterine ablation      Allergies  Allergen Reactions  . Midazolam Nausea And Vomiting  . Mucinex Dm [Dm-Guaifenesin Er]     Reports it made her loopy, able to take robitussin    History   Social History  . Marital Status: Single    Spouse Name: N/A    Number of Children: 1  . Years  of Education: N/A   Occupational History  . Finances, sedentary    Social History Main Topics  . Smoking status: Never Smoker   . Smokeless tobacco: Never Used  . Alcohol Use: Yes     Comment: Rare  . Drug Use: No  . Sexual Activity: Not on file   Other Topics Concern  . Not on file   Social History Narrative   Single, 1 child, boy 66 y/o  dx w/ Asperger's               Family History  Problem Relation Age of Onset  . Alzheimer's disease Mother   . Hypertension Father   . Heart failure Father   . Osteoporosis Sister     BP 114/76  Pulse 68  Ht 5\' 4"  (1.626 m)  Wt 155 lb (70.308 kg)  BMI 26.59 kg/m2  Review of Systems: See HPI above.    Objective:  Physical Exam:  Gen: NAD  Right leg: No gross deformity, swelling, bruising. TTP mid-calf mildly.  No palpable cords. FROM ankle with mild pain on full dorsiflexion and plantarflexion. Strength 5/5, able to  do calf raise but painful especially one leggd. NVI distally. Thompsons test negative.    Assessment & Plan:  1. Right calf strain - Continue with calf sleeve.  Heel lifts, reviewed home exercise program as well.  Avoid barefoot walking, flat shoes, inclines as much as possible.  Cross training.  F/u in 6 weeks.

## 2014-05-13 ENCOUNTER — Other Ambulatory Visit: Payer: Self-pay | Admitting: Internal Medicine

## 2014-05-16 ENCOUNTER — Other Ambulatory Visit: Payer: Self-pay | Admitting: Internal Medicine

## 2014-05-17 MED ORDER — LEVOTHYROXINE SODIUM 112 MCG PO TABS: 112.0000 ug | ORAL_TABLET | Freq: Every day | ORAL | Status: DC

## 2014-05-17 NOTE — Telephone Encounter (Signed)
Pt has F/U appt scheduled for 12/3, Synthroid refilled to [facility].

## 2014-05-26 ENCOUNTER — Ambulatory Visit: Payer: PRIVATE HEALTH INSURANCE | Admitting: Internal Medicine

## 2014-06-09 ENCOUNTER — Encounter: Payer: Self-pay | Admitting: Internal Medicine

## 2014-06-09 ENCOUNTER — Other Ambulatory Visit: Payer: Self-pay

## 2014-06-09 MED ORDER — NORETHINDRONE-ETH ESTRADIOL 0.4-35 MG-MCG PO TABS: 1.0000 | ORAL_TABLET | Freq: Every day | ORAL | Status: DC

## 2014-06-11 ENCOUNTER — Ambulatory Visit (INDEPENDENT_AMBULATORY_CARE_PROVIDER_SITE_OTHER): Payer: PRIVATE HEALTH INSURANCE | Admitting: Internal Medicine

## 2014-06-11 ENCOUNTER — Encounter: Payer: Self-pay | Admitting: Internal Medicine

## 2014-06-11 VITALS — BP 130/80 | HR 74 | Temp 98.3°F | Ht 64.0 in | Wt 157.2 lb

## 2014-06-11 DIAGNOSIS — R05 Cough: Secondary | ICD-10-CM

## 2014-06-11 DIAGNOSIS — J019 Acute sinusitis, unspecified: Secondary | ICD-10-CM

## 2014-06-11 DIAGNOSIS — R059 Cough, unspecified: Secondary | ICD-10-CM

## 2014-06-11 MED ORDER — AMOXICILLIN-POT CLAVULANATE 875-125 MG PO TABS: 1.0000 | ORAL_TABLET | Freq: Two times a day (BID) | ORAL | Status: DC

## 2014-06-11 MED ORDER — ALBUTEROL SULFATE HFA 108 (90 BASE) MCG/ACT IN AERS: 2.0000 | INHALATION_SPRAY | Freq: Four times a day (QID) | RESPIRATORY_TRACT | Status: DC | PRN

## 2014-06-11 MED ORDER — BENZONATATE 100 MG PO CAPS: 100.0000 mg | ORAL_CAPSULE | Freq: Three times a day (TID) | ORAL | Status: DC | PRN

## 2014-06-11 NOTE — Progress Notes (Signed)
Pre visit review using our clinic review tool, if applicable. No additional management support is needed unless otherwise documented below in the visit note. 

## 2014-06-11 NOTE — Progress Notes (Signed)
Subjective:    Patient ID: Rachel Maynard, female    DOB: [birth year], 53 y.o.   MRN: [id number]  HPI 53 year old female with past history of hypothyroidism who comes in today as a work in with concerns regarding a possible sinus infection.  States symptoms started over one week ago.  Reports increased sinus drainage.  No fever.  Ears popping.  Some increased chest congestion.  Increased sinus pressure.  Green mucus production.  Eating and drinking well.  Minimal nausea.  No vomiting.  No diarrhea.  Unable to take mucinex DM.  Taking claritin.  Reports increased cough.  No sob.     Past Medical History  Diagnosis Date  . Hypothyroidism   . Colon polyps   . BCC (basal cell carcinoma of skin)      s/p [facility] 2009  . Dyspnea     DOE- CP, 3-10 normal stress  ECHO, 4-10 CT neg for PE, saw pulmonary  . Genital herpes     On Valtrex    Current Outpatient Prescriptions on File Prior to Visit  Medication Sig Dispense Refill  . EPIPEN 2-PAK 0.3 MG/0.3ML SOAJ injection as needed.    Marland Kitchen levothyroxine (SYNTHROID, LEVOTHROID) 112 MCG tablet Take 1 tablet (112 mcg total) by mouth daily before breakfast. 90 tablet 1  . naproxen (NAPROSYN) 500 MG tablet Take 1 tablet (500 mg total) by mouth 2 (two) times daily as needed (take 2x per day for 2 weeks then as needed). 60 tablet 0  . nitroGLYCERIN (NITRODUR - DOSED IN MG/24 HR) 0.2 mg/hr patch Apply 1/4th patch to the affected calf muscle, change daily. 30 patch 1  . norethindrone-ethinyl estradiol (BALZIVA) 0.4-35 MG-MCG tablet Take 1 tablet by mouth daily. 1 Package 0  . valACYclovir (VALTREX) 500 MG tablet Take 500 mg by mouth daily. As needed , genital herpes    . fluticasone (FLONASE) 50 MCG/ACT nasal spray Place 1 spray into the nose 2 (two) times daily as needed for rhinitis. 16 g 2   No current facility-administered medications on file prior to visit.    Review of Systems Patient denies any headache, lightheadedness or dizziness.  Increased sinus  pressure.  Increased drainage and sore throat.  Ears popping.  No chest pain, tightness or palpitations.  No increased shortness of breath.  Persistent increased cough.    No vomiting.  Minimal nausea.  No abdominal pain or cramping.  No bowel change, such as diarrhea.         Objective:   Physical Exam Filed Vitals:   06/11/14 1038  BP: 130/80  Pulse: 74  Temp: 98.3 F (62.59 C)   53 year old female in no acute distress.   HEENT:  Nares- erythematous turbinates.   Oropharynx - without lesions.  Increased tenderness to palpation over the sinuses.  Ears- TMs visualized without erythema.   NECK:  Supple.  Nontender. HEART:  Appears to be regular. LUNGS:  No crackles or wheezing audible.  Respirations even and unlabored.     Assessment & Plan:  1. Acute sinusitis, recurrence not specified, unspecified location Symptoms and exam as outlined.  Symptoms feel c/w her previous sinus infections.  Treat with augmentin as directed.  Saline nasal spray and flonase nasal spray as directed.  Rest.  Fluids.  Explained to her that if symptoms worsened to did not improve she was to be reevaluated.    2. Cough Cough as outlined.  Treat sinus infection as outlined.  Tessalon perles as directed.  Follow.

## 2014-06-11 NOTE — Patient Instructions (Signed)
Saline nasal spray - flush nose at least 2-3x/day  Continue flonase nasal spray - 2 sprays each nostril one time per day.  Do this in the evening.    Take the antibiotic twice a day with food.   Align - one per day

## 2014-06-12 ENCOUNTER — Encounter: Payer: Self-pay | Admitting: Internal Medicine

## 2014-06-12 DIAGNOSIS — J329 Chronic sinusitis, unspecified: Secondary | ICD-10-CM | POA: Insufficient documentation

## 2014-06-16 ENCOUNTER — Ambulatory Visit (INDEPENDENT_AMBULATORY_CARE_PROVIDER_SITE_OTHER): Payer: PRIVATE HEALTH INSURANCE | Admitting: Internal Medicine

## 2014-06-16 ENCOUNTER — Encounter: Payer: Self-pay | Admitting: Internal Medicine

## 2014-06-16 VITALS — BP 122/68 | HR 67 | Temp 98.0°F | Ht 64.0 in | Wt 158.0 lb

## 2014-06-16 DIAGNOSIS — E039 Hypothyroidism, unspecified: Secondary | ICD-10-CM

## 2014-06-16 LAB — TSH: TSH: 1.74 u[IU]/mL (ref 0.35–4.50)

## 2014-06-16 NOTE — Patient Instructions (Addendum)
Get your blood work before you leave   Please come back to the office by 08-2014 for a physical exam. Come back fasting    

## 2014-06-16 NOTE — Progress Notes (Signed)
Pre visit review using our clinic review tool, if applicable. No additional management support is needed unless otherwise documented below in the visit note. 

## 2014-06-16 NOTE — Progress Notes (Signed)
Subjective:    Patient ID: Rachel Maynard, female    DOB: 08/26/1960, 53 y.o.   MRN: [id number]  DOS:  06/16/2014 Type of visit - description : rov Interval history: Doing well, she increased her Synthroid dose and feeling better   ROS Fatigue has decreased. Denies chest pain, dyspnea on exertion. No anxiety or depression per se, has good and bad days Was seen recently elsewhere with a sinus infection, already antibiotics.  Past Medical History  Diagnosis Date  . Hypothyroidism   . Colon polyps   . BCC (basal cell carcinoma of skin)      s/p [facility] 2009  . Dyspnea     DOE- CP, 3-10 normal stress  ECHO, 4-10 CT neg for PE, saw pulmonary  . Genital herpes     On Valtrex    Past Surgical History  Procedure Laterality Date  . Appendectomy  1980  . C section  2012  . Cholecystectomy  2004  . Uterine ablation      History   Social History  . Marital Status: Single    Spouse Name: N/A    Number of Children: 1  . Years of Education: N/A   Occupational History  . Finances, sedentary    Social History Main Topics  . Smoking status: Never Smoker   . Smokeless tobacco: Never Used  . Alcohol Use: 0.0 oz/week    0 Not specified per week     Comment: Rare  . Drug Use: No  . Sexual Activity: Not on file   Other Topics Concern  . Not on file   Social History Narrative   Single, 1 child, boy 16 y/o  dx w/ Asperger's                   Medication List       This list is accurate as of: 06/16/14 11:59 PM.  Always use your most recent med list.               albuterol 108 (90 BASE) MCG/ACT inhaler  Commonly known as:  PROVENTIL HFA;VENTOLIN HFA  Inhale 2 puffs into the lungs every 6 (six) hours as needed for wheezing or shortness of breath.     amoxicillin-clavulanate 875-125 MG per tablet  Commonly known as:  AUGMENTIN  Take 1 tablet by mouth 2 (two) times daily.     benzonatate 100 MG capsule  Commonly known as:  TESSALON  Take 1 capsule (100 mg total)  by mouth 3 (three) times daily as needed for cough.     EPIPEN 2-PAK 0.3 mg/0.3 mL Soaj injection  Generic drug:  EPINEPHrine  as needed.     fluticasone 50 MCG/ACT nasal spray  Commonly known as:  FLONASE  Place 1 spray into the nose 2 (two) times daily as needed for rhinitis.     levothyroxine 112 MCG tablet  Commonly known as:  SYNTHROID, LEVOTHROID  Take 1 tablet (112 mcg total) by mouth daily before breakfast.     norethindrone-ethinyl estradiol 0.4-35 MG-MCG tablet  Commonly known as:  BALZIVA  Take 1 tablet by mouth daily.     valACYclovir 500 MG tablet  Commonly known as:  VALTREX  Take 500 mg by mouth daily. As needed , genital herpes           Objective:   Physical Exam BP 122/68 mmHg  Pulse 67  Temp(Src) 98 F (36.7 C) (Oral)  Ht 5\' 4"  (1.626 m)  Wt 158 lb (  71.668 kg)  BMI 27.11 kg/m2  SpO2 100% General -- alert, well-developed, NAD.  Lungs -- normal respiratory effort, no intercostal retractions, no accessory muscle use, and normal breath sounds.  Heart-- normal rate, regular rhythm, no murmur.   Extremities-- no pretibial edema bilaterally  Neurologic--  alert & oriented X3. Speech normal, gait appropriate for age, strength symmetric and appropriate for age.  Psych-- Cognition and judgment appear intact. Cooperative with normal attention span and concentration. No anxious or depressed appearing.        Assessment & Plan:

## 2014-06-16 NOTE — Assessment & Plan Note (Signed)
Few months ago she complained about some fatigue, Synthroid dose was increased, TSH decreased to around 0.7. She feels better. Plan: Check a TSH, goal TSH below 1.0.

## 2014-06-20 ENCOUNTER — Encounter: Payer: Self-pay | Admitting: Internal Medicine

## 2014-06-21 ENCOUNTER — Other Ambulatory Visit: Payer: Self-pay

## 2014-06-21 MED ORDER — LEVOTHYROXINE SODIUM 137 MCG PO TABS: 137.0000 ug | ORAL_TABLET | Freq: Every day | ORAL | Status: DC

## 2014-06-21 NOTE — Addendum Note (Signed)
Addended by: Wilfrid Lund on: 06/21/2014 08:05 AM   Modules accepted: Orders, Medications

## 2014-07-31 ENCOUNTER — Encounter: Payer: Self-pay | Admitting: Internal Medicine

## 2014-08-01 ENCOUNTER — Telehealth: Payer: Self-pay

## 2014-08-01 NOTE — Telephone Encounter (Signed)
-----   Message from Irven Baltimore sent at 08/01/2014 11:52 AM EST ----- Per Deloris Ping message from dr. Larose Kells, patient needs labs to have thyroid levels rechecked.  Lab appointment scheduled. Please enter orders.

## 2014-08-01 NOTE — Telephone Encounter (Signed)
TSH already ordered on 06/21/2014. Thanks.

## 2014-08-05 ENCOUNTER — Other Ambulatory Visit (INDEPENDENT_AMBULATORY_CARE_PROVIDER_SITE_OTHER): Payer: 59

## 2014-08-05 DIAGNOSIS — E039 Hypothyroidism, unspecified: Secondary | ICD-10-CM | POA: Diagnosis not present

## 2014-08-05 LAB — TSH: TSH: 0.1 u[IU]/mL — ABNORMAL LOW (ref 0.35–4.50)

## 2014-08-08 ENCOUNTER — Encounter: Payer: Self-pay | Admitting: Internal Medicine

## 2014-08-08 MED ORDER — LEVOTHYROXINE SODIUM 125 MCG PO TABS: 125.0000 ug | ORAL_TABLET | Freq: Every day | ORAL | Status: DC

## 2014-08-08 NOTE — Addendum Note (Signed)
Addended by: Wilfrid Lund on: 08/08/2014 01:09 PM   Modules accepted: Orders, Medications

## 2014-08-26 ENCOUNTER — Encounter: Payer: Self-pay | Admitting: Internal Medicine

## 2014-08-29 LAB — HM MAMMOGRAPHY: HM Mammogram: NORMAL

## 2014-09-19 ENCOUNTER — Telehealth: Payer: Self-pay | Admitting: Internal Medicine

## 2014-09-19 NOTE — Telephone Encounter (Signed)
Pre visit letter sent  °

## 2014-09-28 ENCOUNTER — Other Ambulatory Visit: Payer: Self-pay

## 2014-09-29 ENCOUNTER — Encounter: Payer: Self-pay | Admitting: Internal Medicine

## 2014-09-30 ENCOUNTER — Other Ambulatory Visit (INDEPENDENT_AMBULATORY_CARE_PROVIDER_SITE_OTHER): Payer: 59

## 2014-09-30 DIAGNOSIS — E039 Hypothyroidism, unspecified: Secondary | ICD-10-CM

## 2014-09-30 LAB — TSH: TSH: 0.8 u[IU]/mL (ref 0.35–4.50)

## 2014-10-05 ENCOUNTER — Encounter: Payer: Self-pay | Admitting: *Deleted

## 2014-10-05 ENCOUNTER — Telehealth: Payer: Self-pay | Admitting: *Deleted

## 2014-10-05 NOTE — Telephone Encounter (Signed)
Pre-Visit Call completed with patient and chart updated.   Pre-Visit Info documented in Specialty Comments under SnapShot.    

## 2014-10-06 ENCOUNTER — Other Ambulatory Visit: Payer: Self-pay

## 2014-10-06 ENCOUNTER — Ambulatory Visit (INDEPENDENT_AMBULATORY_CARE_PROVIDER_SITE_OTHER): Payer: 59 | Admitting: Internal Medicine

## 2014-10-06 ENCOUNTER — Encounter: Payer: Self-pay | Admitting: Internal Medicine

## 2014-10-06 VITALS — BP 116/78 | HR 67 | Temp 98.2°F | Ht 64.0 in | Wt 158.0 lb

## 2014-10-06 DIAGNOSIS — Z0000 Encounter for general adult medical examination without abnormal findings: Secondary | ICD-10-CM

## 2014-10-06 MED ORDER — EPINEPHRINE 0.3 MG/0.3ML IJ SOAJ: 0.3000 mg | Freq: Once | INTRAMUSCULAR | Status: DC

## 2014-10-06 NOTE — Progress Notes (Signed)
Subjective:    Patient ID: Rachel Maynard, female    DOB: 01/29/1961, 54 y.o.   MRN: [id number]  DOS:  10/06/2014 Type of visit - description : cpx Interval history: In general doing well, she does have some symptoms, see review of systems    Review of Systems Still concern about her tendency to accumulate fat around her abdomen. For years has occasional sharp pain at the left upper chest, not with exercise, lasting seconds (recommend observation) Continue with on and off, mild blurred vision and brain "fog" , last few seconds. When asked about her energy level states that is better than last year, denies feeling sleepy, does not know if she snores. Otherwise review of systems is negative.  Constitutional: No fever, chills.  HEENT: No dental problems, ear discharge, facial swelling, voice changes. No eye discharge, redness or intolerance to light Respiratory: No wheezing or difficulty breathing. No cough , mucus production Cardiovascular: No  leg swelling or palpitations GI: no nausea, vomiting, diarrhea or abdominal pain.  No blood in the stools. No dysphagia   Endocrine: No polyphagia, polyuria or polydipsia GU: No dysuria, gross hematuria, difficulty urinating. No urinary urgency or frequency. Musculoskeletal: No joint swellings or unusual aches or pains Skin: No change in the color of the skin, palor or rash Allergic, immunologic: No environmental allergies or food allergies Neurological: No dizziness or syncope. No headaches. No diplopia, slurred speech, motor deficits, facial numbness Hematological: No enlarged lymph nodes, easy bruising or bleeding Psychiatry: No suicidal ideas, hallucinations, behavior problems or confusion. No unusual/severe anxiety or depression.     Past Medical History  Diagnosis Date  . Hypothyroidism   . Colon polyps   . BCC (basal cell carcinoma of skin)      s/p [facility] 2009, sees derm  . Dyspnea     DOE- CP, 3-10 normal stress  ECHO, 4-10 CT neg  for PE, saw pulmonary  . Genital herpes     On Valtrex    Past Surgical History  Procedure Laterality Date  . Appendectomy  1980  . C section  2012  . Cholecystectomy  2004  . Uterine ablation      History   Social History  . Marital Status: Single    Spouse Name: N/A  . Number of Children: 1  . Years of Education: N/A   Occupational History  . Finances, sedentary    Social History Main Topics  . Smoking status: Never Smoker   . Smokeless tobacco: Never Used  . Alcohol Use: 0.0 oz/week    0 Standard drinks or equivalent per week     Comment: Rare  . Drug Use: No  . Sexual Activity: Not on file   Other Topics Concern  . Not on file   Social History Narrative   Single, 1 child, boy 42 y/o  dx w/ Asperger's                Family History  Problem Relation Age of Onset  . Alzheimer's disease Mother   . Hypertension Father   . Heart failure Father   . Heart disease Father 41    MI  . Osteoporosis Sister   . Diabetes Other     mother side   . Colon cancer Other     father side  . Colon polyps Father   . Breast cancer Neg Hx        Medication List       This list is accurate as  of: 10/06/14  6:29 PM.  Always use your most recent med list.               albuterol 108 (90 BASE) MCG/ACT inhaler  Commonly known as:  PROVENTIL HFA;VENTOLIN HFA  Inhale 2 puffs into the lungs every 6 (six) hours as needed for wheezing or shortness of breath.     EPINEPHrine 0.3 mg/0.3 mL Soaj injection  Commonly known as:  EPIPEN 2-PAK  Inject 0.3 mLs (0.3 mg total) into the muscle once.     levothyroxine 112 MCG tablet  Commonly known as:  SYNTHROID, LEVOTHROID  Take 112 mcg by mouth daily before breakfast.     norethindrone-ethinyl estradiol 0.4-35 MG-MCG tablet  Commonly known as:  BALZIVA  Take 1 tablet by mouth daily.     valACYclovir 500 MG tablet  Commonly known as:  VALTREX  Take 500 mg by mouth daily. As needed , genital herpes             Objective:   Physical Exam BP 116/78 mmHg  Pulse 67  Temp(Src) 98.2 F (36.8 C) (Oral)  Ht 5\' 4"  (1.626 m)  Wt 158 lb (71.668 kg)  BMI 27.11 kg/m2  SpO2 99% General:   Well developed, well nourished . NAD.  Neck:  Full range of motion. Supple. No  thyromegaly , normal carotid pulse HEENT:  Normocephalic . Face symmetric, atraumatic Lungs:  CTA B Normal respiratory effort, no intercostal retractions, no accessory muscle use. Heart: RRR,  no murmur.  Abdomen:  Not distended, soft, non-tender. No rebound or rigidity. No mass,organomegaly Muscle skeletal: no pretibial edema bilaterally  Skin: Exposed areas without rash. Not pale. Not jaundice Neurologic:  alert & oriented X3.  Speech normal, gait appropriate for age and unassisted Strength symmetric and appropriate for age.  Psych: Cognition and judgment appear intact.  Cooperative with normal attention span and concentration.  Behavior appropriate. No anxious or depressed appearing.        Assessment & Plan:

## 2014-10-06 NOTE — Patient Instructions (Signed)
Please schedule labs to be done within few days (fasting)  Come back to the office in 1` year for a physical exam  Please schedule an appointment at the front desk    Come back fasting

## 2014-10-06 NOTE — Assessment & Plan Note (Addendum)
Td 2015  Cscope x 2 ,  06-2005 (in [facility]) they found polyps Cscope 2008 normal.   cscope 02-2012, (-), next  54 years   female care per gyn Pap--per gyn  , told next due 2017 MMG--08-2014 --neg  dexa-- never had Labs including HIV, Q73 and folic acid. Diet-exercise discussed   Other issues: Hypothyroidism, unable to take Synthroid more than 112 g because with higher dosis  has a feeling of pounding in her head. Check a TSH Continue with occasional symptoms such as "brain fog and blurred vision". Her energy level however is better this year. Etiology of sx unclear, in addition to her regular labs will check a folic acid and A19.FXT to call if sx increase  Next visit in one year but likely will need TSH before next visit

## 2014-10-06 NOTE — Progress Notes (Signed)
Pre visit review using our clinic review tool, if applicable. No additional management support is needed unless otherwise documented below in the visit note. 

## 2014-10-17 ENCOUNTER — Other Ambulatory Visit (INDEPENDENT_AMBULATORY_CARE_PROVIDER_SITE_OTHER): Payer: PRIVATE HEALTH INSURANCE

## 2014-10-17 DIAGNOSIS — Z0000 Encounter for general adult medical examination without abnormal findings: Secondary | ICD-10-CM

## 2014-10-17 LAB — COMPREHENSIVE METABOLIC PANEL
ALK PHOS: 66 U/L (ref 39–117)
ALT: 11 U/L (ref 0–35)
AST: 17 U/L (ref 0–37)
Albumin: 3.8 g/dL (ref 3.5–5.2)
BUN: 15 mg/dL (ref 6–23)
CALCIUM: 8.5 mg/dL (ref 8.4–10.5)
CO2: 24 meq/L (ref 19–32)
Chloride: 107 mEq/L (ref 96–112)
Creatinine, Ser: 0.69 mg/dL (ref 0.40–1.20)
GFR: 94.74 mL/min (ref >60.00)
Glucose, Bld: 87 mg/dL (ref 70–99)
Potassium: 3.9 mEq/L (ref 3.5–5.1)
SODIUM: 136 meq/L (ref 135–145)
TOTAL PROTEIN: 6.3 g/dL (ref 6.0–8.3)
Total Bilirubin: 1.2 mg/dL (ref 0.2–1.2)

## 2014-10-17 LAB — LIPID PANEL
Cholesterol: 150 mg/dL (ref 0–200)
HDL: 56.9 mg/dL (ref >39.00)
LDL Cholesterol: 76 mg/dL (ref 0–99)
NonHDL: 93.1
Total CHOL/HDL Ratio: 3
Triglycerides: 84 mg/dL (ref 0.0–149.0)
VLDL: 16.8 mg/dL (ref 0.0–40.0)

## 2014-10-17 LAB — CBC WITH DIFFERENTIAL/PLATELET
Basophils Absolute: 0 10*3/uL (ref 0.0–0.1)
Basophils Relative: 0.6 % (ref 0.0–3.0)
EOS ABS: 0.3 10*3/uL (ref 0.0–0.7)
Eosinophils Relative: 4.4 % (ref 0.0–5.0)
HEMATOCRIT: 36.8 % (ref 36.0–46.0)
Hemoglobin: 12.8 g/dL (ref 12.0–15.0)
Lymphocytes Relative: 28.7 % (ref 12.0–46.0)
Lymphs Abs: 1.8 10*3/uL (ref 0.7–4.0)
MCHC: 34.9 g/dL (ref 30.0–36.0)
MCV: 90.3 fl (ref 78.0–100.0)
Monocytes Absolute: 0.5 10*3/uL (ref 0.1–1.0)
Monocytes Relative: 7.4 % (ref 3.0–12.0)
NEUTROS ABS: 3.7 10*3/uL (ref 1.4–7.7)
Neutrophils Relative %: 58.9 % (ref 43.0–77.0)
Platelets: 230 10*3/uL (ref 150.0–400.0)
RBC: 4.07 Mil/uL (ref 3.87–5.11)
RDW: 12.3 % (ref 11.5–15.5)
WBC: 6.2 10*3/uL (ref 4.0–10.5)

## 2014-10-17 LAB — FOLATE: FOLATE: 16.4 ng/mL (ref >5.9)

## 2014-10-17 LAB — TSH: TSH: 0.38 u[IU]/mL (ref 0.35–4.50)

## 2014-10-17 LAB — VITAMIN B12: Vitamin B-12: 239 pg/mL (ref 211–911)

## 2014-10-18 LAB — HIV ANTIBODY (ROUTINE TESTING W REFLEX): HIV 1&2 Ab, 4th Generation: NONREACTIVE

## 2014-10-26 ENCOUNTER — Telehealth: Payer: Self-pay

## 2014-10-26 ENCOUNTER — Encounter: Payer: Self-pay | Admitting: Internal Medicine

## 2014-10-26 DIAGNOSIS — E039 Hypothyroidism, unspecified: Secondary | ICD-10-CM

## 2014-10-26 NOTE — Telephone Encounter (Signed)
Referral placed to Endo.

## 2014-10-26 NOTE — Telephone Encounter (Signed)
Spoke with Pt regarding MyChart message received today regarding endo referral. Informed by Pt that she still doesn't feel like her TSH is right, and wanted to speak with "someone who has more knowledge on Free T3 and T4." Informed her I would let Dr. Larose Kells know. Pt verbalized understanding. Given okay to place endo referral by Dr. Larose Kells.

## 2014-11-09 ENCOUNTER — Telehealth: Payer: Self-pay | Admitting: Internal Medicine

## 2014-11-09 ENCOUNTER — Other Ambulatory Visit: Payer: Self-pay

## 2014-11-09 MED ORDER — LEVOTHYROXINE SODIUM 112 MCG PO TABS: 112.0000 ug | ORAL_TABLET | Freq: Every day | ORAL | Status: DC

## 2014-11-09 NOTE — Telephone Encounter (Signed)
30 day supply sent to [facility] on [address] as requested.

## 2014-11-09 NOTE — Telephone Encounter (Signed)
Caller name: Bogg, Mayme  Relation to pt: self Call back number: [phone] Pharmacy: [id number] [place], [place] [phone] (Phone) [phone] (Fax)         Reason for call:  Pt requesting a 30 day supply levothyroxine (SYNTHROID, LEVOTHROID) 112 MCG tablet please send to retail pharmacy. Mail order is taking to long as per pt.

## 2015-02-10 ENCOUNTER — Other Ambulatory Visit: Payer: Self-pay | Admitting: Internal Medicine

## 2015-04-21 ENCOUNTER — Other Ambulatory Visit: Payer: Self-pay | Admitting: Internal Medicine

## 2015-08-28 ENCOUNTER — Encounter (HOSPITAL_BASED_OUTPATIENT_CLINIC_OR_DEPARTMENT_OTHER): Payer: Self-pay

## 2015-08-28 ENCOUNTER — Emergency Department (HOSPITAL_BASED_OUTPATIENT_CLINIC_OR_DEPARTMENT_OTHER)
Admission: EM | Admit: 2015-08-28 | Discharge: 2015-08-28 | Disposition: A | Discharge status: Home / Self Care | Payer: 59 | Attending: Emergency Medicine | Admitting: Emergency Medicine

## 2015-08-28 DIAGNOSIS — M545 Low back pain: Secondary | ICD-10-CM | POA: Insufficient documentation

## 2015-08-28 NOTE — ED Notes (Signed)
Pt reports yesterday was picking something up and with sudden pain in lower lumbar area.  Pain has gotten acutely worse today in midlower lumbar area.  Denies n/t or radiation down legs.  Full control of bowel/bladder.

## 2015-08-28 NOTE — ED Notes (Signed)
Pt noted to be standing and no longer sitting in the stretcher chair in waiting room.  Offered wheelchair for additional comfort but pt states it "feels better to stand and stretch it out."

## 2015-08-28 NOTE — ED Notes (Signed)
Pt notified registration that she was leaving, ambulatory to car per staff.

## 2015-09-12 ENCOUNTER — Telehealth: Payer: Self-pay | Admitting: Internal Medicine

## 2015-09-12 NOTE — Telephone Encounter (Signed)
Pt says that she pulled something in her back and need to be referred to a Physical Therapist. Pt says that she was seen at a location that completed x-rays. She says that; that office will be happy to provide images to PCP if needed.   CB # if needed. [id number]

## 2015-09-13 ENCOUNTER — Encounter: Payer: Self-pay | Admitting: Family Medicine

## 2015-09-13 ENCOUNTER — Ambulatory Visit (INDEPENDENT_AMBULATORY_CARE_PROVIDER_SITE_OTHER): Payer: 59 | Admitting: Family Medicine

## 2015-09-13 VITALS — BP 126/83 | HR 76 | Ht 64.0 in | Wt 160.0 lb

## 2015-09-13 DIAGNOSIS — S39012A Strain of muscle, fascia and tendon of lower back, initial encounter: Secondary | ICD-10-CM | POA: Diagnosis not present

## 2015-09-13 DIAGNOSIS — S3992XA Unspecified injury of lower back, initial encounter: Secondary | ICD-10-CM | POA: Diagnosis not present

## 2015-09-13 MED ORDER — NAPROXEN 500 MG PO TABS
500.0000 mg | ORAL_TABLET | Freq: Two times a day (BID) | ORAL | Status: DC | PRN
Start: 2015-09-13 — End: 2015-10-31

## 2015-09-13 NOTE — Telephone Encounter (Signed)
Advised by CMA that no new appt is needed to be scheduled. Pt will come in as scheduled in July

## 2015-09-13 NOTE — Patient Instructions (Signed)
You have a lumbar strain. Naproxen 500mg  twice a day with food for pain and inflammation. Consider Tramadol or vicodin as needed for severe pain (no driving on this medicine). Take your muscle relaxant as needed for muscle spasms and call me if you need more of this. Stay as active as possible. Consider massage, chiropractor, physical therapy, and/or acupuncture. Physical therapy has been shown to be helpful while the others have mixed results. Start physical therapy. Strengthening of low back muscles, abdominal musculature are key for long term pain relief. If not improving, will consider further imaging (MRI). Follow up with me in 4 weeks for reevaluation.

## 2015-09-13 NOTE — Telephone Encounter (Signed)
Pt has not been seen since April 2016, she will need appt w/ Dr. Larose Kells before referral can be placed.

## 2015-09-13 NOTE — Telephone Encounter (Signed)
Pt has appt w/ Dr. Barbaraann Barthel today at 1400. She has CPE scheduled for July 2017.

## 2015-09-13 NOTE — Telephone Encounter (Signed)
Arrange PT referral, DX back pain. Needs office visit if not promptly improving. Also, needs a follow-up next month for a CPX.

## 2015-09-15 DIAGNOSIS — S3992XA Unspecified injury of lower back, initial encounter: Secondary | ICD-10-CM | POA: Insufficient documentation

## 2015-09-15 NOTE — Progress Notes (Signed)
PCP: Kathlene November, MD  Subjective:   HPI: Patient is a 55 y.o. female here for back pain.  Patient reports on 3/11 she was picking up a table and felt a sharp pain in low back that radiates up the back. Pain has improved some since then but still 2/10 when standing, 4/10 with prolonged sitting, sharp. Went to urgent care twice - given a couple injections and has been taking naproxen 500 bid, muscle relaxant. Had radiographs but I'm unable to review these. Pain radiates into buttocks. No numbness, tingling. No bowel/bladder dysfunction.  Past Medical History  Diagnosis Date  . Hypothyroidism   . Colon polyps   . BCC (basal cell carcinoma of skin)      s/p [facility] 2009, sees derm  . Dyspnea     DOE- CP, 3-10 normal stress  ECHO, 4-10 CT neg for PE, saw pulmonary  . Genital herpes     On Valtrex    Current Outpatient Prescriptions on File Prior to Visit  Medication Sig Dispense Refill  . albuterol (PROVENTIL HFA;VENTOLIN HFA) 108 (90 BASE) MCG/ACT inhaler Inhale 2 puffs into the lungs every 6 (six) hours as needed for wheezing or shortness of breath. (Patient not taking: Reported on 10/06/2014) 1 Inhaler 0  . EPINEPHrine (EPIPEN 2-PAK) 0.3 mg/0.3 mL IJ SOAJ injection Inject 0.3 mLs (0.3 mg total) into the muscle once. 3 Device 0  . levothyroxine (SYNTHROID, LEVOTHROID) 112 MCG tablet Take 1 tablet (112 mcg total) by mouth daily before breakfast. 90 tablet 1  . norethindrone-ethinyl estradiol (BALZIVA) 0.4-35 MG-MCG tablet Take 1 tablet by mouth daily. 1 Package 0  . valACYclovir (VALTREX) 500 MG tablet Take 500 mg by mouth daily. As needed , genital herpes     No current facility-administered medications on file prior to visit.    Past Surgical History  Procedure Laterality Date  . Appendectomy  1980  . C section  2012  . Cholecystectomy  2004  . Uterine ablation      Allergies  Allergen Reactions  . Midazolam Nausea And Vomiting  . Mucinex Dm [Dm-Guaifenesin Er]     Reports it  made her loopy, able to take robitussin    Social History   Social History  . Marital Status: Single    Spouse Name: N/A  . Number of Children: 1  . Years of Education: N/A   Occupational History  . Finances, sedentary    Social History Main Topics  . Smoking status: Never Smoker   . Smokeless tobacco: Never Used  . Alcohol Use: No     Comment: Rare  . Drug Use: No  . Sexual Activity: Not on file   Other Topics Concern  . Not on file   Social History Narrative   Single, 1 child, boy 45 y/o  dx w/ Asperger's               Family History  Problem Relation Age of Onset  . Alzheimer's disease Mother   . Hypertension Father   . Heart failure Father   . Heart disease Father 7    MI  . Osteoporosis Sister   . Diabetes Other     mother side   . Colon cancer Other     father side  . Colon polyps Father   . Breast cancer Neg Hx     BP 126/83 mmHg  Pulse 76  Ht 5\' 4"  (1.626 m)  Wt 160 lb (72.576 kg)  BMI 27.45 kg/m2  Review  of Systems: See HPI above.    Objective:  Physical Exam:  Gen: NAD, comfortable in exam room  Back: No gross deformity, scoliosis. TTP bilateral lumbar paraspinal regions.  No midline or bony TTP. FROM with pain on extension > flexion. Strength LEs 5/5 all muscle groups.   2+ MSRs in patellar and achilles tendons, equal bilaterally. Negative SLRs. Sensation intact to light touch bilaterally. Negative logroll bilateral hips Negative fabers and piriformis stretches.    Assessment & Plan:  1. Low back injury - 2/2 strain.  Naproxen with muscle relaxant as needed.  Start physical therapy and home exercises.  Consider tramadol as needed.  F/u in 4 weeks.  Consider MRI if not improving as expected.

## 2015-09-15 NOTE — Assessment & Plan Note (Signed)
2/2 strain.  Naproxen with muscle relaxant as needed.  Start physical therapy and home exercises.  Consider tramadol as needed.  F/u in 4 weeks.  Consider MRI if not improving as expected.

## 2015-10-04 ENCOUNTER — Encounter: Payer: Self-pay | Admitting: Physical Therapy

## 2015-10-04 ENCOUNTER — Ambulatory Visit: Payer: 59 | Attending: Family Medicine | Admitting: Physical Therapy

## 2015-10-04 DIAGNOSIS — M545 Low back pain, unspecified: Secondary | ICD-10-CM

## 2015-10-04 NOTE — Therapy (Signed)
[facility] [address] [place], [place], [address] Phone: [phone]   Fax:  [phone]  Physical Therapy Evaluation  Patient Details  Name: Rachel Maynard MRN: [id number] Date of Birth: Jan 09, 1961 Referring Provider: Barbaraann Barthel  Encounter Date: 10/04/2015      PT End of Session - 10/04/15 0907    Visit Number 1   Date for PT Re-Evaluation 12/04/15   PT Start Time 0833   PT Stop Time 0930   PT Time Calculation (min) 57 min   Activity Tolerance Patient tolerated treatment well   Behavior During Therapy [facility] for tasks assessed/performed      Past Medical History  Diagnosis Date  . Hypothyroidism   . Colon polyps   . BCC (basal cell carcinoma of skin)      s/p [facility] 2009, sees derm  . Dyspnea     DOE- CP, 3-10 normal stress  ECHO, 4-10 CT neg for PE, saw pulmonary  . Genital herpes     On Valtrex    Past Surgical History  Procedure Laterality Date  . Appendectomy  1980  . C section  2012  . Cholecystectomy  2004  . Uterine ablation      There were no vitals filed for this visit.       Subjective Assessment - 10/04/15 0843    Subjective Patient reports about a month ago sh tried to lift something and felt a pulling in the low back, reports difficulty walking for a few days.  She reports about a week later there was a large pop and she felt better.  She still has some pain.   Currently in Pain? Yes   Pain Score 2    Pain Location Back   Pain Orientation Lower   Pain Descriptors / Indicators Aching;Spasm;Sore;Tightness   Pain Type Acute pain   Pain Onset 1 to 4 weeks ago   Pain Frequency Intermittent   Aggravating Factors  sitting for >15 minutes, bending pain can be up to 4/10 now, a month ago her pain was a 10/10   Pain Relieving Factors rest   Effect of Pain on Daily Activities prevent future issues            [facility] PT Assessment - 10/04/15 0001    Assessment   Medical Diagnosis LBP    Referring Provider [place]   Onset Date/Surgical Date 09/03/15   Prior Therapy no   Precautions   Precautions None   Balance Screen   Has the patient fallen in the past 6 months No   Has the patient had a decrease in activity level because of a fear of falling?  No   Is the patient reluctant to leave their home because of a fear of falling?  No   Home Environment   Additional Comments does hosuework, yardwork   Prior Function   Level of Independence Independent with basic ADLs   Vocation Full time employment   Vocation Requirements sitting all day   Leisure likes to walk for exercise   Posture/Postural Control   Posture Comments fwd head   AROM   Overall AROM Comments lumbar ROM was decreased 50% with c/o tightness and stiffness, pain with trunk rotation   Strength   Overall Strength Comments 4-/5 with no increase of pain   Flexibility   Soft Tissue Assessment /Muscle Length --  very tight HS, calves, pirifromis and ITB   Palpation   Palpation comment tender in the  L/S and SI area, some spasms int he lumbar p-spinals   Special Tests    Special Tests --  SLR with HS tightness at 40 degrees                   [facility] Adult PT Treatment/Exercise - 10/04/15 0001    Modalities   Modalities Electrical Stimulation;Moist Heat   Moist Heat Therapy   Number Minutes Moist Heat 15 Minutes   Moist Heat Location Lumbar Spine   Electrical Stimulation   Electrical Stimulation Location L/S area   Electrical Stimulation Action IFC   Electrical Stimulation Parameters supine   Electrical Stimulation Goals Pain                PT Education - 10/04/15 0907    Education provided Yes   Education Details HEP for HS and piriformis flexibility   Person(s) Educated Patient   Methods Explanation;Demonstration;Handout   Comprehension Verbalized understanding          PT Short Term Goals - 10/04/15 0914    PT SHORT TERM GOAL #1   Title independent with intial HEP   Time 1    Period Weeks   Status New           PT Long Term Goals - 10/04/15 0914    PT LONG TERM GOAL #1   Title understand proper posture and body mechanics   Time 8   Period Weeks   Status New   PT LONG TERM GOAL #2   Title Decrease pain with sitting 50%   Time 8   Period Weeks   Status New   PT LONG TERM GOAL #3   Title increase lumbar flexibility 25%   Time 8   Period Weeks   Status New   PT LONG TERM GOAL #4   Title increase SLR to 70 degrees   Time 8   Period Weeks   Status New               Plan - 10/04/15 0908    Clinical Impression Statement Patient reports that she had severe back pain about a month ago when lifting a table, she reports that about a week later she twisted and felt a big pop and a lot of her pain resolved, she continues to have some pain with sitting and twisting.  She is very tight in the LE's as her job requires sitting all day.   Rehab Potential Good   PT Frequency 2x / week   PT Duration 8 weeks   PT Treatment/Interventions ADLs/Self Care Home Management;Electrical Stimulation;Moist Heat;Therapeutic exercise;Therapeutic activities;Traction;Neuromuscular re-education;Patient/family education;Manual techniques   PT Next Visit Plan Work on flexibility, core strength and posture and body mechanics   Consulted and Agree with Plan of Care Patient      Patient will benefit from skilled therapeutic intervention in order to improve the following deficits and impairments:  Decreased range of motion, Decreased strength, Increased fascial restricitons, Increased muscle spasms, Impaired flexibility, Postural dysfunction, Improper body mechanics, Pain  Visit Diagnosis: Bilateral low back pain without sciatica - Plan: PT plan of care cert/re-cert     Problem List Patient Active Problem List   Diagnosis Date Noted  . Lower back injury 09/15/2015  . Sinusitis 06/12/2014  . Cough 06/12/2014  . Patellofemoral pain syndrome 08/25/2013  .  Gastrocnemius tear 04/08/2013  . Calf pain 10/21/2012  . Annual physical exam 08/14/2011  . GENITAL HERPES 07/20/2010  . Hypothyroidism 09/07/2008    Grafton Warzecha  W., PT 10/04/2015, 9:18 AM  [address] [place], [place], [address] Phone: [phone]   Fax:  [phone]  Name: Rachel Maynard MRN: [id number] Date of Birth: Nov 08, 1960

## 2015-10-09 ENCOUNTER — Telehealth: Payer: Self-pay | Admitting: Internal Medicine

## 2015-10-09 NOTE — Telephone Encounter (Signed)
Pt sent my chart msg needing cpe before 01/18/16. Offered 4/18 3:30pm but she is unable to come due to work obligation. 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 Please advise.

## 2015-10-09 NOTE — Telephone Encounter (Signed)
Okay to put two 15 minute appts together.

## 2015-10-10 ENCOUNTER — Ambulatory Visit: Payer: 59 | Admitting: Physical Therapy

## 2015-10-11 ENCOUNTER — Encounter: Payer: PRIVATE HEALTH INSURANCE | Admitting: Internal Medicine

## 2015-10-11 ENCOUNTER — Ambulatory Visit: Payer: 59 | Admitting: Family Medicine

## 2015-10-18 ENCOUNTER — Other Ambulatory Visit: Payer: Self-pay | Admitting: Internal Medicine

## 2015-10-18 LAB — HM MAMMOGRAPHY: HM Mammogram: NORMAL (ref 0–4)

## 2015-10-18 NOTE — Telephone Encounter (Signed)
Refill sent per [facility] refill protocol/SLS  

## 2015-10-31 ENCOUNTER — Encounter: Payer: Self-pay | Admitting: *Deleted

## 2015-10-31 ENCOUNTER — Telehealth: Payer: Self-pay | Admitting: *Deleted

## 2015-10-31 ENCOUNTER — Ambulatory Visit: Payer: 59 | Attending: Family Medicine | Admitting: Physical Therapy

## 2015-10-31 ENCOUNTER — Encounter: Payer: Self-pay | Admitting: Physical Therapy

## 2015-10-31 ENCOUNTER — Ambulatory Visit: Payer: 59 | Admitting: Physical Therapy

## 2015-10-31 DIAGNOSIS — M545 Low back pain, unspecified: Secondary | ICD-10-CM

## 2015-10-31 NOTE — Telephone Encounter (Signed)
Pre-Visit Call completed with patient and chart updated.   Pre-Visit Info documented in Specialty Comments under SnapShot.    

## 2015-10-31 NOTE — Therapy (Signed)
[place] [place], [place], [address] Phone: [phone]   Fax:  [phone]  Physical Therapy Treatment  Patient Details  Name: Rachel Maynard MRN: [id number] Date of Birth: 1960-08-24 Referring Provider: Barbaraann Barthel  Encounter Date: 10/31/2015      PT End of Session - 10/31/15 0840    Visit Number 2   Date for PT Re-Evaluation 12/04/15   PT Start Time 0752   PT Stop Time 0825   PT Time Calculation (min) 33 min      Past Medical History  Diagnosis Date  . Hypothyroidism   . Colon polyps   . BCC (basal cell carcinoma of skin)      s/p [facility] 2009, sees derm  . Dyspnea     DOE- CP, 3-10 normal stress  ECHO, 4-10 CT neg for PE, saw pulmonary  . Genital herpes     On Valtrex    Past Surgical History  Procedure Laterality Date  . Appendectomy  1980  . C section  2012  . Cholecystectomy  2004  . Uterine ablation      There were no vitals filed for this visit.      Subjective Assessment - 10/31/15 0754    Subjective feeling really good, just don't want to reinjury   Currently in Pain? No/denies                         [facility] Adult PT Treatment/Exercise - 10/31/15 0001    Self-Care   Self-Care ADL's;Lifting;Posture  desk ergonomics   Exercises   Exercises Lumbar;Knee/Hip   Lumbar Exercises: Stretches   Active Hamstring Stretch 2 reps;30 seconds  seated and standing for variations to encourage stretching   Lumbar Exercises: Aerobic   Stationary Bike Nustep L 5 5 min                PT Education - 10/31/15 0840    Education provided Yes   Education Details supine core stab,HS stretch, green tband hip 3 way,scap stab green tband   Person(s) Educated Patient   Methods Explanation;Demonstration;Handout   Comprehension Verbalized understanding;Returned demonstration          PT Short Term Goals - 10/31/15 0841    PT SHORT TERM GOAL #1   Title independent with  intial HEP   Status Achieved           PT Long Term Goals - 10/31/15 0841    PT LONG TERM GOAL #1   Title understand proper posture and body mechanics   Status Achieved   PT LONG TERM GOAL #2   Title Decrease pain with sitting 50%   Status Achieved   PT LONG TERM GOAL #3   Title increase lumbar flexibility 25%   Status On-going   PT LONG TERM GOAL #4   Title increase SLR to 70 degrees   Status On-going               Plan - 10/31/15 0841    Clinical Impression Statement STG met and progressing with LTG. Tight HS,showed variations to encourage increased stretching. Pt with good understanding of BM and desk ergo after explanation. Good demo of issued HEP.   PT Next Visit Plan HOLD 2 weeks and if no pt f/u D/C as pt feels she is doing well and just needs HEP.      Patient will benefit from skilled therapeutic intervention in order to  improve the following deficits and impairments:     Visit Diagnosis: Bilateral low back pain without sciatica     Problem List Patient Active Problem List   Diagnosis Date Noted  . Lower back injury 09/15/2015  . Sinusitis 06/12/2014  . Cough 06/12/2014  . Patellofemoral pain syndrome 08/25/2013  . Gastrocnemius tear 04/08/2013  . Calf pain 10/21/2012  . Annual physical exam 08/14/2011  . GENITAL HERPES 07/20/2010  . Hypothyroidism 09/07/2008    PAYSEUR,ANGIE PTA 10/31/2015, 8:44 AM  [address] [place], [place], [address] Phone: [phone]   Fax:  [phone]  Name: Rachel Maynard MRN: [id number] Date of Birth: [birth year]

## 2015-11-01 ENCOUNTER — Encounter: Payer: Self-pay | Admitting: Internal Medicine

## 2015-11-01 ENCOUNTER — Ambulatory Visit (INDEPENDENT_AMBULATORY_CARE_PROVIDER_SITE_OTHER): Payer: 59 | Admitting: Internal Medicine

## 2015-11-01 VITALS — BP 112/76 | HR 66 | Temp 98.1°F | Ht 64.0 in | Wt 161.0 lb

## 2015-11-01 DIAGNOSIS — E559 Vitamin D deficiency, unspecified: Secondary | ICD-10-CM | POA: Diagnosis not present

## 2015-11-01 DIAGNOSIS — Z0000 Encounter for general adult medical examination without abnormal findings: Secondary | ICD-10-CM

## 2015-11-01 DIAGNOSIS — E039 Hypothyroidism, unspecified: Secondary | ICD-10-CM | POA: Diagnosis not present

## 2015-11-01 DIAGNOSIS — Z09 Encounter for follow-up examination after completed treatment for conditions other than malignant neoplasm: Secondary | ICD-10-CM

## 2015-11-01 LAB — CBC WITH DIFFERENTIAL/PLATELET
BASOS PCT: 0.6 % (ref 0.0–3.0)
Basophils Absolute: 0 10*3/uL (ref 0.0–0.1)
EOS ABS: 0.2 10*3/uL (ref 0.0–0.7)
EOS PCT: 2.2 % (ref 0.0–5.0)
HCT: 40 % (ref 36.0–46.0)
HEMOGLOBIN: 13.6 g/dL (ref 12.0–15.0)
LYMPHS PCT: 35.7 % (ref 12.0–46.0)
Lymphs Abs: 3 10*3/uL (ref 0.7–4.0)
MCHC: 34 g/dL (ref 30.0–36.0)
MCV: 90.7 fl (ref 78.0–100.0)
MONOS PCT: 7 % (ref 3.0–12.0)
Monocytes Absolute: 0.6 10*3/uL (ref 0.1–1.0)
NEUTROS ABS: 4.5 10*3/uL (ref 1.4–7.7)
Neutrophils Relative %: 54.5 % (ref 43.0–77.0)
PLATELETS: 244 10*3/uL (ref 150.0–400.0)
RBC: 4.41 Mil/uL (ref 3.87–5.11)
RDW: 12.5 % (ref 11.5–15.5)
WBC: 8.3 10*3/uL (ref 4.0–10.5)

## 2015-11-01 LAB — LIPID PANEL
CHOLESTEROL: 176 mg/dL (ref 0–200)
HDL: 61.3 mg/dL (ref >39.00)
LDL CALC: 100 mg/dL — AB (ref 0–99)
NonHDL: 114.72
TRIGLYCERIDES: 74 mg/dL (ref 0.0–149.0)
Total CHOL/HDL Ratio: 3
VLDL: 14.8 mg/dL (ref 0.0–40.0)

## 2015-11-01 LAB — TSH: TSH: 0.25 u[IU]/mL — ABNORMAL LOW (ref 0.35–4.50)

## 2015-11-01 LAB — VITAMIN D 25 HYDROXY (VIT D DEFICIENCY, FRACTURES): VITD: 20.11 ng/mL — AB (ref 30.00–100.00)

## 2015-11-01 NOTE — Progress Notes (Signed)
Subjective:    Patient ID: Rachel Maynard, female    DOB: 22-Apr-1961, 55 y.o.   MRN: [id number]  DOS:  11/01/2015 Type of visit - description : CPX Interval history:  No major concerns   Review of Systems Constitutional: No fever. No chills. No unexplained wt changes. No unusual sweats  HEENT: No dental problems, no ear discharge, no facial swelling, no voice changes. No eye discharge, no eye  redness , no  intolerance to light   Respiratory: No wheezing , no  difficulty breathing. No cough , no mucus production  Cardiovascular: No CP, no leg swelling , no  Palpitations  GI: no nausea, no vomiting, no diarrhea , no  abdominal pain.  No blood in the stools. No dysphagia, no odynophagia    Endocrine: No polyphagia, no polyuria , no polydipsia  GU: No dysuria, gross hematuria, difficulty urinating. No urinary urgency, no frequency. Since she stopped taking BCP, has sporadic hot flashes, history of uterine ablation, no vag bleeding.  Musculoskeletal: No joint swellings or unusual aches or pains  Skin: No change in the color of the skin, palor , no  Rash  Allergic, immunologic: No environmental allergies , no  food allergies  Neurological: No dizziness no  syncope. No headaches. No diplopia, no slurred, no slurred speech, no motor deficits, no facial  Numbness  Hematological: No enlarged lymph nodes, no easy bruising , no unusual bleedings  Psychiatry: No suicidal ideas, no hallucinations, no beavior problems, no confusion.  No unusual/severe anxiety, no depression   Past Medical History  Diagnosis Date  . Hypothyroidism   . Colon polyps   . BCC (basal cell carcinoma of skin)      s/p [facility] 2009, sees derm  . Dyspnea     DOE- CP, 3-10 normal stress  ECHO, 4-10 CT neg for PE, saw pulmonary  . Genital herpes     On Valtrex    Past Surgical History  Procedure Laterality Date  . Appendectomy  1980  . C section  2012  . Cholecystectomy  2004  . Uterine ablation       Social History   Social History  . Marital Status: Single    Spouse Name: N/A  . Number of Children: 1  . Years of Education: N/A   Occupational History  . Finances, sedentary    Social History Main Topics  . Smoking status: Never Smoker   . Smokeless tobacco: Never Used  . Alcohol Use: No     Comment: Rare  . Drug Use: No  . Sexual Activity: Not on file   Other Topics Concern  . Not on file   Social History Narrative   Single, 1 child, boy 46 y/o  dx w/ Asperger's                Family History  Problem Relation Age of Onset  . Alzheimer's disease Mother   . Hypertension Father   . Maynard failure Father   . Maynard disease Father 58    MI  . Osteoporosis Sister   . Diabetes Other     mother side   . Colon cancer Other     father side  . Colon polyps Father   . Breast cancer Neg Hx   . Lymphoma Other     uncle, aunt F side        Medication List       This list is accurate as of: 11/01/15 11:59 PM.  Always  use your most recent med list.               albuterol 108 (90 Base) MCG/ACT inhaler  Commonly known as:  PROVENTIL HFA;VENTOLIN HFA  Inhale 2 puffs into the lungs every 6 (six) hours as needed for wheezing or shortness of breath.     EPINEPHrine 0.3 mg/0.3 mL Soaj injection  Commonly known as:  EPIPEN 2-PAK  Inject 0.3 mLs (0.3 mg total) into the muscle once.     levothyroxine 112 MCG tablet  Commonly known as:  SYNTHROID, LEVOTHROID  TAKE 1 TABLET DAILY BEFORE BREAKFAST     valACYclovir 500 MG tablet  Commonly known as:  VALTREX  Take 500 mg by mouth daily. As needed , genital herpes           Objective:   Physical Exam BP 112/76 mmHg  Pulse 66  Temp(Src) 98.1 F (36.7 C) (Oral)  Ht 5\' 4"  (1.626 m)  Wt 161 lb (73.029 kg)  BMI 27.62 kg/m2  SpO2 98%  General:   Well developed, well nourished . NAD.  Neck: No  thyromegaly  HEENT:  Normocephalic . Face symmetric, atraumatic Lungs:  CTA B Normal respiratory effort, no  intercostal retractions, no accessory muscle use. Maynard: RRR,  no murmur.  No pretibial edema bilaterally  Abdomen:  Not distended, soft, non-tender. No rebound or rigidity.   Skin: Exposed areas without rash. Not pale. Not jaundice Neurologic:  alert & oriented X3.  Speech normal, gait appropriate for age and unassisted Strength symmetric and appropriate for age.  Psych: Cognition and judgment appear intact.  Cooperative with normal attention span and concentration.  Behavior appropriate. No anxious or depressed appearing.    Assessment & Plan:   Assessment Hypothyroidism Genital herpes, Valtrex Perimenopausal  Peanuts allergy-- epipen BCC, Moh's  surgery 2009, dermatology DOE, chest pain: CT Chest (-)  PE, normal stress echo, saw pulmonary  PLAN Hypothyroidism: Continue Synthroid, check a TSH. Call for refills when needed RTC one year

## 2015-11-01 NOTE — Patient Instructions (Signed)
GO TO THE LAB : Get the blood work     GO TO THE FRONT DESK Schedule your next appointment for a physical exam in one year, fasting is optional     We may need to recheck your thyroid in few months  Take calcium 1 g daily and vitamin D between 600 units an 1000 units

## 2015-11-01 NOTE — Progress Notes (Signed)
Pre visit review using our clinic review tool, if applicable. No additional management support is needed unless otherwise documented below in the visit note. 

## 2015-11-01 NOTE — Assessment & Plan Note (Addendum)
Td 2015  Cscope x 2 ,  06-2005 (in [facility]) they found polyps Cscope 2008 normal.   cscope 02-2012, (-), next  55 years   female care per gyn: Pap--per gyn , told next due 2017 -- rec pt to call gyn [facility]--09-2015 --neg  dexa-- never had, recommend calcium and vitamin D supplements Labs: FLP, CBC, TSH, vitamin D. Diet-exercise discussed

## 2015-11-02 DIAGNOSIS — Z09 Encounter for follow-up examination after completed treatment for conditions other than malignant neoplasm: Secondary | ICD-10-CM | POA: Insufficient documentation

## 2015-11-02 NOTE — Assessment & Plan Note (Signed)
Hypothyroidism: Continue Synthroid, check a TSH. Call for refills when needed RTC one year

## 2015-11-03 MED ORDER — LEVOTHYROXINE SODIUM 112 MCG PO TABS: 112.0000 ug | ORAL_TABLET | Freq: Every day | ORAL | Status: DC

## 2015-11-03 MED ORDER — VITAMIN D (ERGOCALCIFEROL) 1.25 MG (50000 UNIT) PO CAPS: 50000.0000 [IU] | ORAL_CAPSULE | ORAL | Status: DC

## 2015-11-03 NOTE — Addendum Note (Signed)
Addended byDamita Dunnings D on: 11/03/2015 11:45 AM   Modules accepted: Orders

## 2016-01-18 ENCOUNTER — Encounter: Payer: 59 | Admitting: Internal Medicine

## 2016-02-02 ENCOUNTER — Encounter: Payer: Self-pay | Admitting: Internal Medicine

## 2016-03-07 ENCOUNTER — Telehealth: Payer: Self-pay

## 2016-03-07 NOTE — Telephone Encounter (Signed)
-----   Message from Colon Branch, MD sent at 03/07/2016  1:24 PM EDT ----- Regarding: Send a message Send a message via mychart--- she's due for a vitamin D and TSH. Schedule at her earliest convenience

## 2016-03-07 NOTE — Telephone Encounter (Signed)
My Chart message sent

## 2016-03-20 ENCOUNTER — Other Ambulatory Visit (INDEPENDENT_AMBULATORY_CARE_PROVIDER_SITE_OTHER): Payer: 59

## 2016-03-20 DIAGNOSIS — E559 Vitamin D deficiency, unspecified: Secondary | ICD-10-CM | POA: Diagnosis not present

## 2016-03-20 DIAGNOSIS — E039 Hypothyroidism, unspecified: Secondary | ICD-10-CM

## 2016-03-20 LAB — TSH: TSH: 0.44 u[IU]/mL (ref 0.35–4.50)

## 2016-03-23 ENCOUNTER — Other Ambulatory Visit: Payer: Self-pay | Admitting: Internal Medicine

## 2016-03-23 LAB — VITAMIN D 1,25 DIHYDROXY
VITAMIN D 1, 25 (OH) TOTAL: 35 pg/mL (ref 18–72)
VITAMIN D2 1, 25 (OH): 26 pg/mL
VITAMIN D3 1, 25 (OH): 9 pg/mL

## 2016-03-24 ENCOUNTER — Encounter: Payer: Self-pay | Admitting: Internal Medicine

## 2016-04-15 LAB — HM PAP SMEAR

## 2016-04-15 LAB — HM DEXA SCAN

## 2016-05-18 ENCOUNTER — Encounter (HOSPITAL_BASED_OUTPATIENT_CLINIC_OR_DEPARTMENT_OTHER): Payer: Self-pay | Admitting: *Deleted

## 2016-05-18 ENCOUNTER — Emergency Department (HOSPITAL_BASED_OUTPATIENT_CLINIC_OR_DEPARTMENT_OTHER)
Admission: EM | Admit: 2016-05-18 | Discharge: 2016-05-18 | Disposition: A | Discharge status: Home / Self Care | Payer: 59 | Attending: Emergency Medicine | Admitting: Emergency Medicine

## 2016-05-18 DIAGNOSIS — K529 Noninfective gastroenteritis and colitis, unspecified: Secondary | ICD-10-CM

## 2016-05-18 DIAGNOSIS — E039 Hypothyroidism, unspecified: Secondary | ICD-10-CM | POA: Diagnosis not present

## 2016-05-18 DIAGNOSIS — Z79899 Other long term (current) drug therapy: Secondary | ICD-10-CM | POA: Diagnosis not present

## 2016-05-18 DIAGNOSIS — R197 Diarrhea, unspecified: Secondary | ICD-10-CM | POA: Diagnosis present

## 2016-05-18 LAB — URINALYSIS, ROUTINE W REFLEX MICROSCOPIC
Bilirubin Urine: NEGATIVE
GLUCOSE, UA: NEGATIVE mg/dL
Hgb urine dipstick: NEGATIVE
Ketones, ur: NEGATIVE mg/dL
LEUKOCYTES UA: NEGATIVE
NITRITE: NEGATIVE
PH: 5.5 (ref 5.0–8.0)
Protein, ur: NEGATIVE mg/dL
SPECIFIC GRAVITY, URINE: 1.01 (ref 1.005–1.030)

## 2016-05-18 LAB — COMPREHENSIVE METABOLIC PANEL
ALBUMIN: 4.5 g/dL (ref 3.5–5.0)
ALT: 24 U/L (ref 14–54)
ANION GAP: 8 (ref 5–15)
AST: 28 U/L (ref 15–41)
Alkaline Phosphatase: 95 U/L (ref 38–126)
BILIRUBIN TOTAL: 2.2 mg/dL — AB (ref 0.3–1.2)
BUN: 20 mg/dL (ref 6–20)
CO2: 25 mmol/L (ref 22–32)
Calcium: 9.2 mg/dL (ref 8.9–10.3)
Chloride: 106 mmol/L (ref 101–111)
Creatinine, Ser: 0.71 mg/dL (ref 0.44–1.00)
GFR calc Af Amer: >60 mL/min (ref >60)
GFR calc non Af Amer: >60 mL/min (ref >60)
GLUCOSE: 124 mg/dL — AB (ref 65–99)
POTASSIUM: 3.9 mmol/L (ref 3.5–5.1)
SODIUM: 139 mmol/L (ref 135–145)
TOTAL PROTEIN: 7.5 g/dL (ref 6.5–8.1)

## 2016-05-18 LAB — CBC WITH DIFFERENTIAL/PLATELET
Basophils Absolute: 0 10*3/uL (ref 0.0–0.1)
Basophils Relative: 0 %
EOS PCT: 0 %
Eosinophils Absolute: 0 10*3/uL (ref 0.0–0.7)
HCT: 40.4 % (ref 36.0–46.0)
Hemoglobin: 14.6 g/dL (ref 12.0–15.0)
LYMPHS ABS: 0.6 10*3/uL — AB (ref 0.7–4.0)
LYMPHS PCT: 4 %
MCH: 31.8 pg (ref 26.0–34.0)
MCHC: 36.1 g/dL — ABNORMAL HIGH (ref 30.0–36.0)
MCV: 88 fL (ref 78.0–100.0)
MONO ABS: 0.4 10*3/uL (ref 0.1–1.0)
Monocytes Relative: 3 %
Neutro Abs: 13.4 10*3/uL — ABNORMAL HIGH (ref 1.7–7.7)
Neutrophils Relative %: 93 %
PLATELETS: 237 10*3/uL (ref 150–400)
RBC: 4.59 MIL/uL (ref 3.87–5.11)
RDW: 12.3 % (ref 11.5–15.5)
WBC: 14.4 10*3/uL — AB (ref 4.0–10.5)

## 2016-05-18 LAB — LIPASE, BLOOD: Lipase: 14 U/L (ref 11–51)

## 2016-05-18 MED ORDER — ONDANSETRON HCL 4 MG PO TABS: 4.0000 mg | ORAL_TABLET | Freq: Four times a day (QID) | ORAL | 0 refills | Status: DC | PRN

## 2016-05-18 MED ORDER — DICYCLOMINE HCL 10 MG/ML IM SOLN
20.0000 mg | Freq: Once | INTRAMUSCULAR | Status: AC
  Administered 2016-05-18: 20 mg via INTRAMUSCULAR
  Filled 2016-05-18: qty 2

## 2016-05-18 MED ORDER — SODIUM CHLORIDE 0.9 % IV BOLUS (SEPSIS)
2000.0000 mL | Freq: Once | INTRAVENOUS | Status: AC
  Administered 2016-05-18 (×2): 1000 mL via INTRAVENOUS

## 2016-05-18 MED ORDER — ONDANSETRON HCL 4 MG/2ML IJ SOLN
4.0000 mg | Freq: Once | INTRAMUSCULAR | Status: AC
  Administered 2016-05-18: 4 mg via INTRAVENOUS
  Filled 2016-05-18: qty 2

## 2016-05-18 MED ORDER — DICYCLOMINE HCL 20 MG PO TABS: 20.0000 mg | ORAL_TABLET | Freq: Two times a day (BID) | ORAL | 0 refills | Status: DC | PRN

## 2016-05-18 NOTE — ED Triage Notes (Signed)
Pt reports n/v/d since 0100. Reports associated chills and abd pain. Denies genitourinary symptoms; denies blood in vomit/stool.

## 2016-05-18 NOTE — ED Provider Notes (Signed)
[facility] Provider Note   CSN: [id number] Arrival date & time: 05/18/16  1057     History   Chief Complaint Chief Complaint  Patient presents with  . Emesis  . Diarrhea    HPI Rachel Maynard is a 55 y.o. female.  HPI Patient percent with acute onset vomiting and diarrhea starting at 1 AM. She's had greater than 10 episodes of each. Describes as watery. No blood in the stool or vomit. Last ate at 6 or 7 PM the night before. Sick contacts. Unknown whether any of her family members developed similar symptoms. States she's not been able to contact them today. She's had subjective fevers and chills. She also describes hyperactive abdomen but denies specific pain. No genitourinary symptoms. Past Medical History:  Diagnosis Date  . BCC (basal cell carcinoma of skin)     s/p [facility] 2009, sees derm  . Colon polyps   . Dyspnea    DOE- CP, 3-10 normal stress  ECHO, 4-10 CT neg for PE, saw pulmonary  . Genital herpes    On Valtrex  . Hypothyroidism     Patient Active Problem List   Diagnosis Date Noted  . PCP NOTES >>>>>>>>>>>>>>>>>>>>>>> 11/02/2015  . Lower back injury 09/15/2015  . Sinusitis 06/12/2014  . Cough 06/12/2014  . Patellofemoral pain syndrome 08/25/2013  . Gastrocnemius tear 04/08/2013  . Calf pain 10/21/2012  . Annual physical exam 08/14/2011  . GENITAL HERPES 07/20/2010  . Hypothyroidism 09/07/2008    Past Surgical History:  Procedure Laterality Date  . APPENDECTOMY  1980  . c section  2012  . CHOLECYSTECTOMY  2004  . Uterine ablation      OB History    No data available       Home Medications    Prior to Admission medications   Medication Sig Start Date End Date Taking? Authorizing Provider  EPINEPHrine (EPIPEN 2-PAK) 0.3 mg/0.3 mL IJ SOAJ injection Inject 0.3 mLs (0.3 mg total) into the muscle once. 10/06/14  Yes Colon Branch, MD  levothyroxine (SYNTHROID, LEVOTHROID) 112 MCG tablet Take 1 tablet (112 mcg total) by mouth daily before  breakfast. 03/25/16  Yes Colon Branch, MD  valACYclovir (VALTREX) 500 MG tablet Take 500 mg by mouth daily. As needed , genital herpes   Yes Historical Provider, MD  Vitamin D, Ergocalciferol, (DRISDOL) 50000 units CAPS capsule Take 1 capsule (50,000 Units total) by mouth every 7 (seven) days. 11/03/15  Yes Colon Branch, MD  albuterol (PROVENTIL HFA;VENTOLIN HFA) 108 (90 BASE) MCG/ACT inhaler Inhale 2 puffs into the lungs every 6 (six) hours as needed for wheezing or shortness of breath. 06/11/14   Einar Pheasant, MD  dicyclomine (BENTYL) 20 MG tablet Take 1 tablet (20 mg total) by mouth 2 (two) times daily as needed for spasms. 05/18/16   Julianne Rice, MD  ondansetron (ZOFRAN) 4 MG tablet Take 1 tablet (4 mg total) by mouth every 6 (six) hours as needed for nausea or vomiting. 05/18/16   Julianne Rice, MD    Family History Family History  Problem Relation Age of Onset  . Hypertension Father   . Heart failure Father   . Heart disease Father 77    MI  . Colon polyps Father   . Alzheimer's disease Mother   . Osteoporosis Sister   . Diabetes Other     mother side   . Colon cancer Other     father side  . Lymphoma Other  uncle, aunt F side   . Breast cancer Neg Hx     Social History Social History  Substance Use Topics  . Smoking status: Never Smoker  . Smokeless tobacco: Never Used  . Alcohol use No     Comment: Rare     Allergies   Midazolam and Mucinex dm [dm-guaifenesin er]   Review of Systems Review of Systems  Constitutional: Positive for chills, fatigue and fever.  Respiratory: Negative for shortness of breath.   Cardiovascular: Negative for chest pain.  Gastrointestinal: Positive for diarrhea, nausea and vomiting. Negative for abdominal distention, abdominal pain, blood in stool and constipation.  Genitourinary: Negative for dysuria, flank pain, frequency and hematuria.  Musculoskeletal: Negative for back pain, myalgias, neck pain and neck stiffness.  Skin:  Negative for rash and wound.  Neurological: Negative for dizziness, light-headedness and numbness.  All other systems reviewed and are negative.    Physical Exam Updated Vital Signs BP 100/64   Pulse 77   Temp 98.2 F (36.8 C) (Oral)   Resp 20   Ht 5\' 4"  (1.626 m)   Wt 165 lb (74.8 kg)   SpO2 98%   BMI 28.32 kg/m   Physical Exam  Constitutional: She is oriented to person, place, and time. She appears well-developed and well-nourished.  HENT:  Head: Normocephalic and atraumatic.  Mouth/Throat: Oropharynx is clear and moist.  Eyes: EOM are normal. Pupils are equal, round, and reactive to light.  Neck: Normal range of motion. Neck supple.  Cardiovascular: Normal rate and regular rhythm.   Pulmonary/Chest: Effort normal and breath sounds normal.  Abdominal: Soft. There is no tenderness. There is no rebound and no guarding.  Mildly hyperactive bowel sounds. Mild diffuse discomfort to palpation without focality. No rebound or guarding.  Musculoskeletal: Normal range of motion. She exhibits no edema or tenderness.  No CVA tenderness.  Neurological: She is alert and oriented to person, place, and time.  Moving all extremities without deficit. Sensation is fully intact.  Skin: Skin is warm and dry. No rash noted. No erythema.  Psychiatric: She has a normal mood and affect. Her behavior is normal.  Nursing note and vitals reviewed.    ED Treatments / Results  Labs (all labs ordered are listed, but only abnormal results are displayed) Labs Reviewed  CBC WITH DIFFERENTIAL/PLATELET - Abnormal; Notable for the following:       Result Value   WBC 14.4 (*)    MCHC 36.1 (*)    Neutro Abs 13.4 (*)    Lymphs Abs 0.6 (*)    All other components within normal limits  COMPREHENSIVE METABOLIC PANEL - Abnormal; Notable for the following:    Glucose, Bld 124 (*)    Total Bilirubin 2.2 (*)    All other components within normal limits  LIPASE, BLOOD  URINALYSIS, ROUTINE W REFLEX  MICROSCOPIC (NOT AT [facility])    EKG  EKG Interpretation None       Radiology No results found.  Procedures Procedures (including critical care time)  Medications Ordered in ED Medications  sodium chloride 0.9 % bolus 2,000 mL (0 mLs Intravenous Stopped 05/18/16 1237)  ondansetron (ZOFRAN) injection 4 mg (4 mg Intravenous Given 05/18/16 1136)  dicyclomine (BENTYL) injection 20 mg (20 mg Intramuscular Given 05/18/16 1138)     Initial Impression / Assessment and Plan / ED Course  I have reviewed the triage vital signs and the nursing notes.  Pertinent labs & imaging results that were available during my care of the  patient were reviewed by me and considered in my medical decision making (see chart for details).  Clinical Course     Suspect foodborne illness versus viral gastroenteritis. Abdominal exam is benign. We'll treat with IV fluids and check basic electrolytes. Patient states she is feeling much better. No further vomiting. She is tolerating liquids. Repeat abdominal exam. Abdomen is soft and nontender. No rebound or guarding. We'll discharge home. Advised to return immediately for any worsening of her abdominal pain, fever, persistent vomiting or for any concerns. Final Clinical Impressions(s) / ED Diagnoses   Final diagnoses:  Gastroenteritis    New Prescriptions New Prescriptions   DICYCLOMINE (BENTYL) 20 MG TABLET    Take 1 tablet (20 mg total) by mouth 2 (two) times daily as needed for spasms.   ONDANSETRON (ZOFRAN) 4 MG TABLET    Take 1 tablet (4 mg total) by mouth every 6 (six) hours as needed for nausea or vomiting.     Julianne Rice, MD 05/18/16 [phone]

## 2016-05-20 ENCOUNTER — Encounter: Payer: Self-pay | Admitting: Internal Medicine

## 2016-05-21 ENCOUNTER — Encounter: Payer: Self-pay | Admitting: Internal Medicine

## 2016-05-21 ENCOUNTER — Ambulatory Visit (INDEPENDENT_AMBULATORY_CARE_PROVIDER_SITE_OTHER): Payer: 59 | Admitting: Internal Medicine

## 2016-05-21 VITALS — BP 112/76 | HR 71 | Temp 98.2°F | Resp 12 | Ht 64.0 in | Wt 162.0 lb

## 2016-05-21 DIAGNOSIS — K529 Noninfective gastroenteritis and colitis, unspecified: Secondary | ICD-10-CM

## 2016-05-21 NOTE — Progress Notes (Signed)
Subjective:    Patient ID: Rachel Maynard, female    DOB: [birth year], 55 y.o.   MRN: [id number]  DOS:  05/21/2016 Type of visit - description : ER follow-up Interval history: Went to the  ER 05/18/2016 with nausea, diarrhea, multiple episodes. WBCs were elevated at 14, the rest of the CBC normal. CMP normal except for a bilirubin of 2.2. Urinalysis was normal. She was treated conservatively and is doing better.  Also, got went to the urgent care with right ear pain, she was prescribed eardrops. Pain is gone but the ear still feels clogged.  Review of Systems Currently with no fever chills. No abdominal pain Appetite slightly decreased, she is following a bland diet but increasing it to a more regular diet slowly.   Past Medical History:  Diagnosis Date  . BCC (basal cell carcinoma of skin)     s/p [facility] 2009, sees derm  . Colon polyps   . Dyspnea    DOE- CP, 3-10 normal stress  ECHO, 4-10 CT neg for PE, saw pulmonary  . Genital herpes    On Valtrex  . Hypothyroidism     Past Surgical History:  Procedure Laterality Date  . APPENDECTOMY  1980  . c section  2012  . CHOLECYSTECTOMY  2004  . Uterine ablation      Social History   Social History  . Marital status: Single    Spouse name: N/A  . Number of children: 1  . Years of education: N/A   Occupational History  . 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 Smoking status: Never Smoker  . Smokeless tobacco: Never Used  . Alcohol use No     Comment: Rare  . Drug use: No  . Sexual activity: Not Currently    Birth control/ protection: None   Other Topics Concern  . Not on file   Social History Narrative   Single, 1 child, boy 40 y/o  dx w/ Asperger's                   Medication List       Accurate as of 05/21/16 11:59 PM. Always use your most recent med list.          albuterol 108 (90 Base) MCG/ACT inhaler Commonly known as:  PROVENTIL HFA;VENTOLIN HFA Inhale 2  puffs into the lungs every 6 (six) hours as needed for wheezing or shortness of breath.   dicyclomine 20 MG tablet Commonly known as:  BENTYL Take 1 tablet (20 mg total) by mouth 2 (two) times daily as needed for spasms.   EPINEPHrine 0.3 mg/0.3 mL Soaj injection Commonly known as:  EPIPEN 2-PAK Inject 0.3 mLs (0.3 mg total) into the muscle once.   levothyroxine 112 MCG tablet Commonly known as:  SYNTHROID, LEVOTHROID Take 1 tablet (112 mcg total) by mouth daily before breakfast.   ondansetron 4 MG tablet Commonly known as:  ZOFRAN Take 1 tablet (4 mg total) by mouth every 6 (six) hours as needed for nausea or vomiting.   valACYclovir 500 MG tablet Commonly known as:  VALTREX Take 500 mg by mouth daily. As needed , genital herpes          Objective:   Physical Exam BP 112/76 (BP Location: Left Arm, Patient Position: Sitting, Cuff Size: Normal)   Pulse 71   Temp 98.2 F (36.8 C) (Oral)   Resp 12   Ht 5\' 4"  (1.626 m)  Wt 162 lb (73.5 kg)   SpO2 98%   BMI 27.81 kg/m  General:   Well developed, well nourished . NAD.  HEENT:  Normocephalic . Face symmetric, atraumatic. Left ear normal.  Right ear: Wax noted that the canal. It was removed with a spoon, she still has some left. Canal is otherwise normal with no redness, discharge. TMs normal. Lungs:  CTA B Normal respiratory effort, no intercostal retractions, no accessory muscle use. Heart: RRR,  no murmur.  no pretibial edema bilaterally  Abdomen:  Not distended, soft, non-tender. No rebound or rigidity.   Skin: Not pale. Not jaundice Neurologic:  alert & oriented X3.  Speech normal, gait appropriate for age and unassisted Psych--  Cognition and judgment appear intact.  Cooperative with normal attention span and concentration.  Behavior appropriate. No anxious or depressed appearing.      Assessment & Plan:   Assessment Hypothyroidism Genital herpes, Valtrex Perimenopausal  Peanuts allergy--  epipen BCC, Moh's  surgery 2009, dermatology DOE, chest pain: CT Chest (-)  PE, normal stress echo, saw pulmonary  PLAN Nausea, diarrhea: Acute gastroenteritis, workup at the ER normal except for elevated white count. She is doing much better with no fever chills or abdominal pain. Recommend to gradually increase her diet to regular. At this point I don't see the need to repeat a CBC. Cerumen impaction:   cleaned with a spoon, she still have some wax. Recommend H2 O2, call for a lavage if needed.

## 2016-05-21 NOTE — Progress Notes (Signed)
Pre visit review using our clinic review tool, if applicable. No additional management support is needed unless otherwise documented below in the visit note. 

## 2016-05-21 NOTE — Patient Instructions (Signed)
H2O2 (Peroxide) OTC: few drops at night  Call if no better in few days for a NURSE VISIT  (ear lavage)

## 2016-05-22 NOTE — Assessment & Plan Note (Signed)
Nausea, diarrhea: Acute gastroenteritis, workup at the ER normal except for elevated white count. She is doing much better with no fever chills or abdominal pain. Recommend to gradually increase her diet to regular. At this point I don't see the need to repeat a CBC. Cerumen impaction:   cleaned with a spoon, she still have some wax. Recommend H2 O2, call for a lavage if needed.

## 2016-05-23 ENCOUNTER — Ambulatory Visit: Payer: 59 | Admitting: *Deleted

## 2016-05-23 DIAGNOSIS — H6690 Otitis media, unspecified, unspecified ear: Secondary | ICD-10-CM | POA: Diagnosis not present

## 2016-05-23 MED ORDER — AMOXICILLIN-POT CLAVULANATE 875-125 MG PO TABS: 1.0000 | ORAL_TABLET | Freq: Two times a day (BID) | ORAL | 0 refills | Status: DC

## 2016-05-23 NOTE — Progress Notes (Signed)
Pre visit review using our clinic review tool, if applicable. No additional management support is needed unless otherwise documented below in the visit note.  Per OV 05/21/2016:  Call if no better in few days for a NURSE VISIT  (ear lavage)      Able to view wax in irritated right ear. Ear lavage performed. Wax removed. Ear canal still appears red and irritated. Mackie Pai PA asked to look at ear-agreed. Antibiotics and follow up ordered. Rx sent to pharmacy for pt to pick up and pt states she will call back to set up f/u appt. Did not order ear drops because pt has active prescription. Pt notified of instructions and verbalized understanding.  I reviewed the note today of RN. This is the plan advised on day of evaluation. We sent in augmenting 875 to take 1 tab po bid x 10 days. I had asked her to follow up in 7 days or sooner if needed

## 2016-07-12 ENCOUNTER — Encounter (HOSPITAL_BASED_OUTPATIENT_CLINIC_OR_DEPARTMENT_OTHER): Payer: Self-pay | Admitting: Emergency Medicine

## 2016-07-12 ENCOUNTER — Emergency Department (HOSPITAL_BASED_OUTPATIENT_CLINIC_OR_DEPARTMENT_OTHER)
Admission: EM | Admit: 2016-07-12 | Discharge: 2016-07-12 | Disposition: A | Discharge status: Home / Self Care | Payer: 59 | Attending: Emergency Medicine | Admitting: Emergency Medicine

## 2016-07-12 DIAGNOSIS — M545 Low back pain, unspecified: Secondary | ICD-10-CM

## 2016-07-12 DIAGNOSIS — E039 Hypothyroidism, unspecified: Secondary | ICD-10-CM | POA: Diagnosis not present

## 2016-07-12 MED ORDER — CYCLOBENZAPRINE HCL 10 MG PO TABS
10.0000 mg | ORAL_TABLET | Freq: Once | ORAL | Status: AC
  Administered 2016-07-12: 10 mg via ORAL
  Filled 2016-07-12: qty 1

## 2016-07-12 MED ORDER — OXYCODONE-ACETAMINOPHEN 5-325 MG PO TABS
2.0000 | ORAL_TABLET | Freq: Once | ORAL | Status: DC
  Filled 2016-07-12: qty 2

## 2016-07-12 MED ORDER — NAPROXEN 500 MG PO TABS: 500.0000 mg | ORAL_TABLET | Freq: Two times a day (BID) | ORAL | 0 refills | Status: DC

## 2016-07-12 MED ORDER — KETOROLAC TROMETHAMINE 60 MG/2ML IM SOLN
60.0000 mg | Freq: Once | INTRAMUSCULAR | Status: AC
  Administered 2016-07-12: 60 mg via INTRAMUSCULAR
  Filled 2016-07-12: qty 2

## 2016-07-12 MED ORDER — CYCLOBENZAPRINE HCL 10 MG PO TABS: 10.0000 mg | ORAL_TABLET | Freq: Two times a day (BID) | ORAL | 0 refills | Status: DC | PRN

## 2016-07-12 MED FILL — NAPROXEN 500 MG TABLET: 500 | 10 days supply | Qty: 20 | Fill #0

## 2016-07-12 MED FILL — CYCLOBENZAPRINE 10 MG TAB: 10 | 10 days supply | Qty: 20 | Fill #0

## 2016-07-12 NOTE — ED Provider Notes (Signed)
[facility] Provider Note   CSN: [id number] Arrival date & time: 07/12/16  [id number]     History   Chief Complaint Chief Complaint  Patient presents with  . Back Pain    HPI Rachel Maynard is a 56 y.o. female.  Patient is a 56 year old female with history of hypothyroidism and previous low back injury. She presents for evaluation of severe lower back pain. This started yesterday after shoveling snow. She denies any radiation of her pain into her legs and denies any weakness or numbness. She denies any bowel or bladder issues.    Back Pain   This is a recurrent problem. The current episode started yesterday. The problem occurs constantly. The problem has been rapidly worsening. The pain is associated with lifting heavy objects. The pain is present in the lumbar spine. The quality of the pain is described as stabbing. The pain does not radiate. The pain is severe. Pertinent negatives include no numbness, no bowel incontinence, no bladder incontinence, no paresthesias, no tingling and no weakness. She has tried NSAIDs for the symptoms.    Past Medical History:  Diagnosis Date  . BCC (basal cell carcinoma of skin)     s/p [facility] 2009, sees derm  . Colon polyps   . Dyspnea    DOE- CP, 3-10 normal stress  ECHO, 4-10 CT neg for PE, saw pulmonary  . Genital herpes    On Valtrex  . Hypothyroidism     Patient Active Problem List   Diagnosis Date Noted  . PCP NOTES >>>>>>>>>>>>>>>>>>>>>>> 11/02/2015  . Lower back injury 09/15/2015  . Cough 06/12/2014  . Patellofemoral pain syndrome 08/25/2013  . Gastrocnemius tear 04/08/2013  . Calf pain 10/21/2012  . Annual physical exam 08/14/2011  . GENITAL HERPES 07/20/2010  . Hypothyroidism 09/07/2008    Past Surgical History:  Procedure Laterality Date  . APPENDECTOMY  1980  . c section  2012  . CHOLECYSTECTOMY  2004  . Uterine ablation      OB History    No data available       Home Medications    Prior to Admission  medications   Medication Sig Start Date End Date Taking? Authorizing Provider  albuterol (PROVENTIL HFA;VENTOLIN HFA) 108 (90 BASE) MCG/ACT inhaler Inhale 2 puffs into the lungs every 6 (six) hours as needed for wheezing or shortness of breath. Patient not taking: Reported on 05/21/2016 06/11/14   Einar Pheasant, MD  amoxicillin-clavulanate (AUGMENTIN) 875-125 MG tablet Take 1 tablet by mouth 2 (two) times daily. 05/23/16   Percell Miller Saguier, PA-C  dicyclomine (BENTYL) 20 MG tablet Take 1 tablet (20 mg total) by mouth 2 (two) times daily as needed for spasms. 05/18/16   Julianne Rice, MD  EPINEPHrine (EPIPEN 2-PAK) 0.3 mg/0.3 mL IJ SOAJ injection Inject 0.3 mLs (0.3 mg total) into the muscle once. Patient not taking: Reported on 05/21/2016 10/06/14   Colon Branch, MD  levothyroxine (SYNTHROID, LEVOTHROID) 112 MCG tablet Take 1 tablet (112 mcg total) by mouth daily before breakfast. 03/25/16   Colon Branch, MD  ondansetron (ZOFRAN) 4 MG tablet Take 1 tablet (4 mg total) by mouth every 6 (six) hours as needed for nausea or vomiting. Patient not taking: Reported on 05/21/2016 05/18/16   Julianne Rice, MD  valACYclovir (VALTREX) 500 MG tablet Take 500 mg by mouth daily. As needed , genital herpes    Historical Provider, MD    Family History Family History  Problem Relation Age of Onset  .  Hypertension Father   . Heart failure Father   . Heart disease Father 27    MI  . Colon polyps Father   . Alzheimer's disease Mother   . Osteoporosis Sister   . Diabetes Other     mother side   . Colon cancer Other     father side  . Lymphoma Other     uncle, aunt F side   . Breast cancer Neg Hx     Social History Social History  Substance Use Topics  . Smoking status: Never Smoker  . Smokeless tobacco: Never Used  . Alcohol use No     Comment: Rare     Allergies   Guaifenesin er and Midazolam   Review of Systems Review of Systems  Gastrointestinal: Negative for bowel incontinence.    Genitourinary: Negative for bladder incontinence.  Musculoskeletal: Positive for back pain.  Neurological: Negative for tingling, weakness, numbness and paresthesias.  All other systems reviewed and are negative.    Physical Exam Updated Vital Signs BP 123/85 (BP Location: Left Arm)   Pulse 79   Temp 98.1 F (36.7 C) (Oral)   Resp 22   Ht 5\' 4"  (1.626 m)   Wt 168 lb (76.2 kg)   SpO2 100%   BMI 28.84 kg/m   Physical Exam  Constitutional: She is oriented to person, place, and time. She appears well-developed and well-nourished. No distress.  HENT:  Head: Normocephalic and atraumatic.  Neck: Normal range of motion. Neck supple.  Cardiovascular: Exam reveals no gallop and no friction rub.   No murmur heard. Pulmonary/Chest: Effort normal. No respiratory distress. She has no wheezes.  Musculoskeletal: Normal range of motion.  There is tenderness to palpation in the soft tissues of the lumbar region.  Neurological: She is alert and oriented to person, place, and time.  Strength is 5 out of 5 in both lower extremities. DTRs are 1+ and symmetrical in the patellar and Achilles tendons bilaterally. She is able to ambulate, however with discomfort.  Skin: Skin is warm and dry. She is not diaphoretic.  Nursing note and vitals reviewed.    ED Treatments / Results  Labs (all labs ordered are listed, but only abnormal results are displayed) Labs Reviewed - No data to display  EKG  EKG Interpretation None       Radiology No results found.  Procedures Procedures (including critical care time)  Medications Ordered in ED Medications  ketorolac (TORADOL) injection 60 mg (not administered)  oxyCODONE-acetaminophen (PERCOCET/ROXICET) 5-325 MG per tablet 2 tablet (not administered)     Initial Impression / Assessment and Plan / ED Course  I have reviewed the triage vital signs and the nursing notes.  Pertinent labs & imaging results that were available during my care of  the patient were reviewed by me and considered in my medical decision making (see chart for details).     Patient presents here with complaints of low back pain that started after shoveling snow yesterday. Her pain seems very musculoskeletal in nature as it is worse when she walks and better when she rests. Her strength and reflexes are symmetrical and there are no bowel or bladder issues. She was given Toradol and Flexeril and is now feeling better. She will be discharged with naproxen and Flexeril and advised to follow-up with her primary Dr. if she is not improving in the next week.  Final Clinical Impressions(s) / ED Diagnoses   Final diagnoses:  None    New Prescriptions New  Prescriptions   No medications on file     Veryl Speak, MD 07/12/16 [phone]

## 2016-07-12 NOTE — Discharge Instructions (Signed)
Naproxen as prescribed. Flexeril as prescribed as needed for pain not relieved with naproxen.  Follow-up with your primary Dr. if your symptoms are not improving in the next week to discuss imaging studies or physical therapy. Return to the emergency department in the meantime if your symptoms significantly worsen or change.

## 2016-07-12 NOTE — ED Triage Notes (Signed)
Patient reports previous back injury and states she was seen for this.  States that she began experiencing back again yesterday after shoveling the driveway.  Reports right lower back pain.  Denies dysuria, hematuria.

## 2016-07-15 ENCOUNTER — Encounter: Payer: Self-pay | Admitting: Family Medicine

## 2016-07-15 ENCOUNTER — Ambulatory Visit (HOSPITAL_BASED_OUTPATIENT_CLINIC_OR_DEPARTMENT_OTHER)
Admission: RE | Admit: 2016-07-15 | Discharge: 2016-07-15 | Disposition: A | Discharge status: Home / Self Care | Payer: 59 | Source: Ambulatory Visit | Attending: Family Medicine | Admitting: Family Medicine

## 2016-07-15 ENCOUNTER — Ambulatory Visit (INDEPENDENT_AMBULATORY_CARE_PROVIDER_SITE_OTHER): Payer: 59 | Admitting: Family Medicine

## 2016-07-15 VITALS — BP 121/79 | HR 72 | Ht 64.0 in | Wt 167.0 lb

## 2016-07-15 DIAGNOSIS — M545 Low back pain, unspecified: Secondary | ICD-10-CM

## 2016-07-15 DIAGNOSIS — S3992XA Unspecified injury of lower back, initial encounter: Secondary | ICD-10-CM | POA: Diagnosis not present

## 2016-07-15 DIAGNOSIS — M5137 Other intervertebral disc degeneration, lumbosacral region: Secondary | ICD-10-CM | POA: Insufficient documentation

## 2016-07-15 MED ORDER — METHOCARBAMOL 500 MG PO TABS: 500.0000 mg | ORAL_TABLET | Freq: Three times a day (TID) | ORAL | 1 refills | Status: DC | PRN

## 2016-07-15 MED ORDER — DICLOFENAC SODIUM 75 MG PO TBEC: 75.0000 mg | DELAYED_RELEASE_TABLET | Freq: Two times a day (BID) | ORAL | 1 refills | Status: DC

## 2016-07-15 MED FILL — METHOCARBAMOL 500 MG TABLET: 500 | 20 days supply | Qty: 60 | Fill #0

## 2016-07-15 MED FILL — DICLOFENAC SOD 75 MG TAB EC: 75 | 30 days supply | Qty: 60 | Fill #0

## 2016-07-15 NOTE — Patient Instructions (Signed)
You have a severe lumbar strain. Ok to take tylenol for baseline pain relief (1-2 extra strength tabs 3x/day) Prednisone dose pack is an option but would like to avoid this if possible. Voltaren twice a day with food for pain and inflammation - take regularly for 7-10 days then as needed. Robaxin as needed for muscle spasms (no driving on this medicine if it makes you sleepy). Stay as active as possible. Physical therapy has been shown to be helpful as well. Strengthening of low back muscles, abdominal musculature are key for long term pain relief. If improving can start doing the home exercises and stretches in the handout If not improving, will consider further imaging (MRI). Call me in a week to let me know how you're doing.

## 2016-07-16 NOTE — Progress Notes (Signed)
PCP: Kathlene November, MD  Subjective:   HPI: Patient is a 56 y.o. female here for low back pain.  Patient report she had a twinge in right side of low back on 1/18 when she was shoveling snow. Woke up next morning and pain was severe, couldn't move or walk. Pain shooting into right hip but not down leg. Worse with standing, sitting, walking - cannot get comfortable. Has improved mildly following injection, muscle relaxant from ED. Pain now is 6/10 and sharp right side of low back. No skin changes, numbness. No bowel/bladder dysfunction.  Past Medical History:  Diagnosis Date  . BCC (basal cell carcinoma of skin)     s/p [facility] 2009, sees derm  . Colon polyps   . Dyspnea    DOE- CP, 3-10 normal stress  ECHO, 4-10 CT neg for PE, saw pulmonary  . Genital herpes    On Valtrex  . Hypothyroidism     Current Outpatient Prescriptions on File Prior to Visit  Medication Sig Dispense Refill  . dicyclomine (BENTYL) 20 MG tablet Take 1 tablet (20 mg total) by mouth 2 (two) times daily as needed for spasms. 20 tablet 0  . EPINEPHrine (EPIPEN 2-PAK) 0.3 mg/0.3 mL IJ SOAJ injection Inject 0.3 mLs (0.3 mg total) into the muscle once. (Patient not taking: Reported on 05/21/2016) 3 Device 0  . levothyroxine (SYNTHROID, LEVOTHROID) 112 MCG tablet Take 1 tablet (112 mcg total) by mouth daily before breakfast. 90 tablet 2  . valACYclovir (VALTREX) 500 MG tablet Take 500 mg by mouth daily. As needed , genital herpes     No current facility-administered medications on file prior to visit.     Past Surgical History:  Procedure Laterality Date  . APPENDECTOMY  1980  . c section  2012  . CHOLECYSTECTOMY  2004  . Uterine ablation      Allergies  Allergen Reactions  . Guaifenesin Er     Other reaction(s): Hallucinations  . Midazolam Nausea And Vomiting    Social History   Social History  . Marital status: Single    Spouse name: N/A  . Number of children: 1  . Years of education: N/A    Occupational History  . 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 Smoking status: Never Smoker  . Smokeless tobacco: Never Used  . Alcohol use No     Comment: Rare  . Drug use: No  . Sexual activity: Not Currently    Birth control/ protection: None   Other Topics Concern  . Not on file   Social History Narrative   Single, 1 child, boy 33 y/o  dx w/ Asperger's               Family History  Problem Relation Age of Onset  . Hypertension Father   . Heart failure Father   . Heart disease Father 34    MI  . Colon polyps Father   . Alzheimer's disease Mother   . Osteoporosis Sister   . Diabetes Other     mother side   . Colon cancer Other     father side  . Lymphoma Other     uncle, aunt F side   . Breast cancer Neg Hx     BP 121/79   Pulse 72   Ht 5\' 4"  (1.626 m)   Wt 167 lb (75.8 kg)   BMI 28.67 kg/m   Review of Systems: See HPI above.  Objective:  Physical Exam:  Gen: NAD, comfortable in exam room  Back: No gross deformity, scoliosis. TTP right lumbar paraspinal region.  No midline or bony TTP. Flexion to 30 degrees, painful.  Full extension, no pain. Strength LEs 5/5 all muscle groups.   2+ MSRs in patellar and achilles tendons, equal bilaterally. Negative SLRs. Sensation intact to light touch bilaterally. Negative logroll bilateral hips Negative fabers and piriformis stretches.   Assessment & Plan:  1. Low back pain - 2/2 severe lumbar strain.  Independently reviewed radiographs - no evidence compression fracture, other abnormalities to account for pain.  Voltaren twice a day, robaxin as needed for spasms.  Shown home exercises, stretches.  Consider physical therapy, MRI depending on how she's doing.

## 2016-07-16 NOTE — Assessment & Plan Note (Signed)
2/2 severe lumbar strain.  Independently reviewed radiographs - no evidence compression fracture, other abnormalities to account for pain.  Voltaren twice a day, robaxin as needed for spasms.  Shown home exercises, stretches.  Consider physical therapy, MRI depending on how she's doing.

## 2016-08-08 ENCOUNTER — Telehealth: Payer: Self-pay | Admitting: Internal Medicine

## 2016-08-08 NOTE — Telephone Encounter (Signed)
lvm advising patient of message below °

## 2016-08-08 NOTE — Telephone Encounter (Signed)
Appointment Request From: Merideth Abbey    With Provider: Kathlene November, MD [facility]]    Preferred Date Range: From 08/08/2016 To 08/12/2016    Preferred Times: Any    Reason for visit: Annual Physical    Comments:  Dr. Larose Kells,    My Health insurance requires updated labs from April 24, 2016 through current for health screening and discounted premiums. I believe my policy allows an annual physical every year and not subject to the 365 day annual. Would you be able to do this on short notice? I was just notified today.    Thank you,  Rheagan

## 2016-08-08 NOTE — Telephone Encounter (Signed)
Last CPE was in 10/2015, can not guarantee her insurance will cover before that time. However, if okay with Pt knowing she may be billed to pay CPE out of pocket, okay to schedule at her convenience.

## 2016-09-09 ENCOUNTER — Ambulatory Visit (INDEPENDENT_AMBULATORY_CARE_PROVIDER_SITE_OTHER): Payer: 59 | Admitting: Family Medicine

## 2016-09-09 ENCOUNTER — Encounter: Payer: Self-pay | Admitting: Family Medicine

## 2016-09-09 DIAGNOSIS — M79671 Pain in right foot: Secondary | ICD-10-CM

## 2016-09-09 NOTE — Patient Instructions (Signed)
You have plantar fasciitis Take diclofenac 75mg  twice a day with food for pain and inflammation (alternatively you could take 2 aleve twice a day with food). Plantar fascia stretch for 20-30 seconds (do 3 of these) in morning Lowering/raise on a step exercises 3 x 10 once or twice a day - this is very important for long term recovery. Can add heel walks, toe walks forward and backward as well Ice heel for 15 minutes as needed. Avoid barefoot walking as much as possible. Arch straps have been shown to help with pain. Wear sports insoles with scaphoid pads (made for you today) when up and walking around. Steroid injection is a consideration for short term pain relief if you are struggling. Physical therapy is also an option. Follow up with me in 4 weeks at the latest but call me if you're having a lot of trouble.  These are the typical arthritis instructions (for your knees): These are the different medications you can take for this: Tylenol 500mg  1-2 tabs three times a day for pain. Aleve 1-2 tabs twice a day with food Capsaicin, aspercreme, or biofreeze topically up to four times a day may also help with pain. Some supplements that may help for arthritis: Boswellia extract, curcumin, pycnogenol Cortisone injections are an option. If cortisone injections do not help, there are different types of shots that may help but they take longer to take effect. It's important that you continue to stay active. Straight leg raises, knee extensions 3 sets of 10 once a day (add ankle weight if these become too easy). Consider physical therapy to strengthen muscles around the joint that hurts to take pressure off of the joint itself. Shoe inserts with good arch support may be helpful. Walker or cane if needed. Heat or ice 15 minutes at a time 3-4 times a day as needed to help with pain. Water aerobics and cycling with low resistance are the best two types of exercise for arthritis.

## 2016-09-12 ENCOUNTER — Encounter: Payer: Self-pay | Admitting: Internal Medicine

## 2016-09-12 DIAGNOSIS — M79671 Pain in right foot: Secondary | ICD-10-CM | POA: Insufficient documentation

## 2016-09-12 NOTE — Progress Notes (Signed)
PCP: Kathlene November, MD  Subjective:   HPI: Patient is a 56 y.o. female here for right foot pain.  Patient reports she's had about 2 weeks of pain in right foot. No acute injury or trauma. Pain level 2/10 but up to 8/10 and sharp with walking. Feels this in her arch. Radiates posteriorly and up into calf. No swelling. No skin changes, numbness.  Past Medical History:  Diagnosis Date  . BCC (basal cell carcinoma of skin)     s/p [facility] 2009, sees derm  . Colon polyps   . Dyspnea    DOE- CP, 3-10 normal stress  ECHO, 4-10 CT neg for PE, saw pulmonary  . Genital herpes    On Valtrex  . Hypothyroidism     Current Outpatient Prescriptions on File Prior to Visit  Medication Sig Dispense Refill  . diclofenac (VOLTAREN) 75 MG EC tablet Take 1 tablet (75 mg total) by mouth 2 (two) times daily. 60 tablet 1  . dicyclomine (BENTYL) 20 MG tablet Take 1 tablet (20 mg total) by mouth 2 (two) times daily as needed for spasms. 20 tablet 0  . EPINEPHrine (EPIPEN 2-PAK) 0.3 mg/0.3 mL IJ SOAJ injection Inject 0.3 mLs (0.3 mg total) into the muscle once. (Patient not taking: Reported on 05/21/2016) 3 Device 0  . levothyroxine (SYNTHROID, LEVOTHROID) 112 MCG tablet Take 1 tablet (112 mcg total) by mouth daily before breakfast. 90 tablet 2  . methocarbamol (ROBAXIN) 500 MG tablet Take 1 tablet (500 mg total) by mouth every 8 (eight) hours as needed. 60 tablet 1  . valACYclovir (VALTREX) 500 MG tablet Take 500 mg by mouth daily. As needed , genital herpes     No current facility-administered medications on file prior to visit.     Past Surgical History:  Procedure Laterality Date  . APPENDECTOMY  1980  . c section  2012  . CHOLECYSTECTOMY  2004  . Uterine ablation      Allergies  Allergen Reactions  . Guaifenesin Er     Other reaction(s): Hallucinations  . Midazolam Nausea And Vomiting    Social History   Social History  . Marital status: Single    Spouse name: N/A  . Number of children: 1   . Years of education: N/A   Occupational History  . 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 Smoking status: Never Smoker  . Smokeless tobacco: Never Used  . Alcohol use No     Comment: Rare  . Drug use: No  . Sexual activity: Not Currently    Birth control/ protection: None   Other Topics Concern  . Not on file   Social History Narrative   Single, 1 child, boy 28 y/o  dx w/ Asperger's               Family History  Problem Relation Age of Onset  . Hypertension Father   . Heart failure Father   . Heart disease Father 32    MI  . Colon polyps Father   . Alzheimer's disease Mother   . Osteoporosis Sister   . Diabetes Other     mother side   . Colon cancer Other     father side  . Lymphoma Other     uncle, aunt F side   . Breast cancer Neg Hx     BP 119/76   Pulse 76   Ht 5\' 4"  (1.626 m)   Wt 165 lb (74.8 kg)  BMI 28.32 kg/m   Review of Systems: See HPI above.     Objective:  Physical Exam:  Gen: NAD, comfortable in exam room  Right foot/ankle: No gross deformity, swelling, ecchymoses.  Cavus arch. FROM TTP within body of plantar fascia. Negative ant drawer and talar tilt.   Negative syndesmotic compression. Negative calcaneal squeeze. Thompsons test negative. NV intact distally.  Left foot/ankle: FROM without pain.   MSK u/s:  Thickening of plantar fascia in area of pain.  No other abnormalities, neovascularity.  Assessment & Plan:  1. Right foot pain - 2/2 plantar fasciitis.  Shown home exercises and stretches to do daily.  Arch supports made today with scaphoid pads.  Arch binders.  Diclofenac twice a day with food.  Icing.  Consider injection, PT if not improving.  F/u in 4 weeks.

## 2016-09-12 NOTE — Assessment & Plan Note (Signed)
2/2 plantar fasciitis.  Shown home exercises and stretches to do daily.  Arch supports made today with scaphoid pads.  Arch binders.  Diclofenac twice a day with food.  Icing.  Consider injection, PT if not improving.  F/u in 4 weeks.

## 2016-10-07 ENCOUNTER — Encounter: Payer: Self-pay | Admitting: Family Medicine

## 2016-10-07 ENCOUNTER — Ambulatory Visit (INDEPENDENT_AMBULATORY_CARE_PROVIDER_SITE_OTHER): Payer: 59 | Admitting: Family Medicine

## 2016-10-07 DIAGNOSIS — M79671 Pain in right foot: Secondary | ICD-10-CM | POA: Diagnosis not present

## 2016-10-07 NOTE — Patient Instructions (Signed)
Continue with the arch binders, arch supports as you have been. Do home exercises daily. Consider physical therapy, injection, custom orthotics. Plantar fascia massage: Tharon Aquas at [place] [phone] Consider active release - Johnston Ebbs at [facility] [phone] Follow up with me in 6 weeks for reevaluation or sooner if you want to try the shot.

## 2016-10-08 ENCOUNTER — Ambulatory Visit: Payer: 59 | Admitting: Sports Medicine

## 2016-10-09 NOTE — Progress Notes (Signed)
PCP: Kathlene November, MD  Subjective:   HPI: Patient is a 56 y.o. female here for right foot pain.  3/19: Patient reports she's had about 2 weeks of pain in right foot. No acute injury or trauma. Pain level 2/10 but up to 8/10 and sharp with walking. Feels this in her arch. Radiates posteriorly and up into calf. No swelling. No skin changes, numbness.  4/16: Patient reports she's only about 20% better. Pain worse plantar heel/right foot by end of day. Pain is 0/10 currently and at rest, up to 8/10 and sharp at worst. Using arch supports and binders. Doing home exercises. No skin changes, numbness.  Past Medical History:  Diagnosis Date  . BCC (basal cell carcinoma of skin)     s/p [facility] 2009, sees derm  . Colon polyps   . Dyspnea    DOE- CP, 3-10 normal stress  ECHO, 4-10 CT neg for PE, saw pulmonary  . Genital herpes    On Valtrex  . Hypothyroidism     Current Outpatient Prescriptions on File Prior to Visit  Medication Sig Dispense Refill  . diclofenac (VOLTAREN) 75 MG EC tablet Take 1 tablet (75 mg total) by mouth 2 (two) times daily. 60 tablet 1  . dicyclomine (BENTYL) 20 MG tablet Take 1 tablet (20 mg total) by mouth 2 (two) times daily as needed for spasms. 20 tablet 0  . EPINEPHrine (EPIPEN 2-PAK) 0.3 mg/0.3 mL IJ SOAJ injection Inject 0.3 mLs (0.3 mg total) into the muscle once. (Patient not taking: Reported on 05/21/2016) 3 Device 0  . levothyroxine (SYNTHROID, LEVOTHROID) 112 MCG tablet Take 1 tablet (112 mcg total) by mouth daily before breakfast. 90 tablet 2  . methocarbamol (ROBAXIN) 500 MG tablet Take 1 tablet (500 mg total) by mouth every 8 (eight) hours as needed. 60 tablet 1  . valACYclovir (VALTREX) 500 MG tablet Take 500 mg by mouth daily. As needed , genital herpes     No current facility-administered medications on file prior to visit.     Past Surgical History:  Procedure Laterality Date  . APPENDECTOMY  1980  . c section  2012  . CHOLECYSTECTOMY  2004   . Uterine ablation      Allergies  Allergen Reactions  . Guaifenesin Er     Other reaction(s): Hallucinations  . Midazolam Nausea And Vomiting    Social History   Social History  . Marital status: Single    Spouse name: N/A  . Number of children: 1  . Years of education: N/A   Occupational History  . 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 Smoking status: Never Smoker  . Smokeless tobacco: Never Used  . Alcohol use No     Comment: Rare  . Drug use: No  . Sexual activity: Not Currently    Birth control/ protection: None   Other Topics Concern  . Not on file   Social History Narrative   Single, 1 child, boy 82 y/o  dx w/ Asperger's               Family History  Problem Relation Age of Onset  . Hypertension Father   . Heart failure Father   . Heart disease Father 78    MI  . Colon polyps Father   . Alzheimer's disease Mother   . Osteoporosis Sister   . Diabetes Other     mother side   . Colon cancer Other  father side  . Lymphoma Other     uncle, aunt F side   . Breast cancer Neg Hx     BP 127/82   Pulse 67   Ht 5\' 4"  (1.626 m)   Wt 165 lb (74.8 kg)   BMI 28.32 kg/m   Review of Systems: See HPI above.     Objective:  Physical Exam:  Gen: NAD, comfortable in exam room  Right foot/ankle: No gross deformity, swelling, ecchymoses.  Cavus arch. FROM TTP within body of plantar fascia and at insertion now on medial calcaneus.. Negative ant drawer and talar tilt.   Negative calcaneal squeeze. Thompsons test negative. NV intact distally.  Left foot/ankle: FROM without pain.  Assessment & Plan:  1. Right foot pain - 2/2 plantar fasciitis.  Continue home exercises, stretches, arch supports, binders.  Diclofenac twice a day if needed.  We discussed physical therapy, injection, custom orthotics also.  She was interested in trying plantar fascia massage as next step.  Will f/u in 6 weeks otherwise.

## 2016-10-09 NOTE — Assessment & Plan Note (Signed)
2/2 plantar fasciitis.  Continue home exercises, stretches, arch supports, binders.  Diclofenac twice a day if needed.  We discussed physical therapy, injection, custom orthotics also.  She was interested in trying plantar fascia massage as next step.  Will f/u in 6 weeks otherwise.

## 2016-10-28 ENCOUNTER — Encounter: Payer: Self-pay | Admitting: Internal Medicine

## 2016-10-28 DIAGNOSIS — Z1159 Encounter for screening for other viral diseases: Secondary | ICD-10-CM

## 2016-10-28 DIAGNOSIS — Z0000 Encounter for general adult medical examination without abnormal findings: Secondary | ICD-10-CM

## 2016-10-29 NOTE — Telephone Encounter (Signed)
See message, enter labs for CMP, FLP, CBC, A1c, TSH ---dxCPX  Received: Today  Message Contents  Colon Branch, MD  Damita Dunnings, [place]

## 2016-10-29 NOTE — Telephone Encounter (Signed)
Okay per PCP to add Free T3 and Free T4 to labs.

## 2016-10-29 NOTE — Telephone Encounter (Signed)
Orders placed.

## 2016-10-29 NOTE — Addendum Note (Signed)
Addended byDamita Dunnings D on: 10/29/2016 05:01 PM   Modules accepted: Orders

## 2016-10-30 ENCOUNTER — Other Ambulatory Visit (INDEPENDENT_AMBULATORY_CARE_PROVIDER_SITE_OTHER): Payer: 59

## 2016-10-30 DIAGNOSIS — Z1159 Encounter for screening for other viral diseases: Secondary | ICD-10-CM

## 2016-10-30 DIAGNOSIS — Z0000 Encounter for general adult medical examination without abnormal findings: Secondary | ICD-10-CM

## 2016-10-30 LAB — TSH: TSH: 0.47 u[IU]/mL (ref 0.35–4.50)

## 2016-10-30 LAB — COMPREHENSIVE METABOLIC PANEL
ALT: 20 U/L (ref 0–35)
AST: 21 U/L (ref 0–37)
Albumin: 4.4 g/dL (ref 3.5–5.2)
Alkaline Phosphatase: 90 U/L (ref 39–117)
BUN: 13 mg/dL (ref 6–23)
CHLORIDE: 106 meq/L (ref 96–112)
CO2: 26 meq/L (ref 19–32)
CREATININE: 0.72 mg/dL (ref 0.40–1.20)
Calcium: 9.5 mg/dL (ref 8.4–10.5)
GFR: 89.51 mL/min (ref >60.00)
Glucose, Bld: 93 mg/dL (ref 70–99)
Potassium: 4.1 mEq/L (ref 3.5–5.1)
SODIUM: 140 meq/L (ref 135–145)
Total Bilirubin: 1.8 mg/dL — ABNORMAL HIGH (ref 0.2–1.2)
Total Protein: 6.8 g/dL (ref 6.0–8.3)

## 2016-10-30 LAB — LIPID PANEL
CHOL/HDL RATIO: 3
Cholesterol: 170 mg/dL (ref 0–200)
HDL: 62.8 mg/dL (ref >39.00)
LDL CALC: 86 mg/dL (ref 0–99)
NonHDL: 106.71
TRIGLYCERIDES: 102 mg/dL (ref 0.0–149.0)
VLDL: 20.4 mg/dL (ref 0.0–40.0)

## 2016-10-30 LAB — CBC WITH DIFFERENTIAL/PLATELET
BASOS ABS: 0.1 10*3/uL (ref 0.0–0.1)
Basophils Relative: 1 % (ref 0.0–3.0)
EOS ABS: 0.2 10*3/uL (ref 0.0–0.7)
Eosinophils Relative: 3.3 % (ref 0.0–5.0)
HEMATOCRIT: 40.9 % (ref 36.0–46.0)
Hemoglobin: 14.3 g/dL (ref 12.0–15.0)
LYMPHS PCT: 33.5 % (ref 12.0–46.0)
Lymphs Abs: 2.2 10*3/uL (ref 0.7–4.0)
MCHC: 34.9 g/dL (ref 30.0–36.0)
MCV: 89.8 fl (ref 78.0–100.0)
MONO ABS: 0.4 10*3/uL (ref 0.1–1.0)
Monocytes Relative: 6.5 % (ref 3.0–12.0)
NEUTROS ABS: 3.7 10*3/uL (ref 1.4–7.7)
Neutrophils Relative %: 55.7 % (ref 43.0–77.0)
PLATELETS: 243 10*3/uL (ref 150.0–400.0)
RBC: 4.55 Mil/uL (ref 3.87–5.11)
RDW: 12.2 % (ref 11.5–15.5)
WBC: 6.6 10*3/uL (ref 4.0–10.5)

## 2016-10-30 LAB — T4, FREE: Free T4: 0.86 ng/dL (ref 0.60–1.60)

## 2016-10-30 LAB — T3, FREE: T3, Free: 3.6 pg/mL (ref 2.3–4.2)

## 2016-10-30 LAB — HEMOGLOBIN A1C: HEMOGLOBIN A1C: 5 % (ref 4.6–6.5)

## 2016-10-30 LAB — HEPATITIS C ANTIBODY: HCV AB: NEGATIVE

## 2016-11-04 ENCOUNTER — Other Ambulatory Visit: Payer: Self-pay

## 2016-11-05 ENCOUNTER — Encounter: Payer: Self-pay | Admitting: Internal Medicine

## 2016-11-05 ENCOUNTER — Ambulatory Visit (INDEPENDENT_AMBULATORY_CARE_PROVIDER_SITE_OTHER): Payer: 59 | Admitting: Internal Medicine

## 2016-11-05 VITALS — BP 118/68 | HR 69 | Temp 97.9°F | Resp 12 | Ht 64.0 in | Wt 167.1 lb

## 2016-11-05 DIAGNOSIS — Z0000 Encounter for general adult medical examination without abnormal findings: Secondary | ICD-10-CM | POA: Diagnosis not present

## 2016-11-05 DIAGNOSIS — E039 Hypothyroidism, unspecified: Secondary | ICD-10-CM

## 2016-11-05 MED ORDER — EPINEPHRINE 0.3 MG/0.3ML IJ SOAJ: 0.3000 mg | Freq: Once | INTRAMUSCULAR | 1 refills | Status: AC

## 2016-11-05 MED ORDER — LEVOTHYROXINE SODIUM 100 MCG PO TABS: 100.0000 ug | ORAL_TABLET | Freq: Every day | ORAL | 6 refills | Status: DC

## 2016-11-05 MED FILL — EPINEPHRINE 0.3 MG AUTO-INJ: 0.3 | 2 days supply | Qty: 2 | Fill #0

## 2016-11-05 MED FILL — LEVOTHYROXINE 100 MCG TAB: 100 | 30 days supply | Qty: 30 | Fill #0

## 2016-11-05 NOTE — Assessment & Plan Note (Signed)
Hypothyroidism: Last TSH 0.47, states that previously she felt the best when the TSH was ~1.0, we agreed to decrease Synthroid from 112 mcg to 100 mcg and recheck a TFTs in 6 weeks. Fatigue: All recent labs okay, no anxiety or depression per se, no sleep apnea on clinical grounds. Rx observation for now. Perimenopausal: Currently with no symptoms, recommend to discuss HRT with gynecology EpiPen RF RTC one year

## 2016-11-05 NOTE — Progress Notes (Signed)
Subjective:    Patient ID: Rachel Maynard, female    DOB: 1960/09/13, 56 y.o.   MRN: [id number]  DOS:  11/05/2016 Type of visit - description : cpx Interval history: Has a few concerns  Wt Readings from Last 3 Encounters:  11/05/16 167 lb 2 oz (75.8 kg)  10/07/16 165 lb (74.8 kg)  09/09/16 165 lb (74.8 kg)    Review of Systems Has fatigue, going on for about a year. At the end of the day she feels extremely tired and she used not feel that way. Denies chest pain, difficulty breathing, does not know if she snores. Does not fall asleep inappropriately. She is very busy, single mother, has a full-time job, + stress but she feels she is handling that well.   Other than above, a 14 point review of systems is negative      Past Medical History:  Diagnosis Date  . BCC (basal cell carcinoma of skin)     s/p [facility] 2009, sees derm  . Colon polyps   . Dyspnea    DOE- CP, 3-10 normal stress  ECHO, 4-10 CT neg for PE, saw pulmonary  . Genital herpes    On Valtrex  . Hypothyroidism     Past Surgical History:  Procedure Laterality Date  . APPENDECTOMY  1980  . c section  2012  . CHOLECYSTECTOMY  2004  . Uterine ablation      Social History   Social History  . Marital status: Single    Spouse name: N/A  . Number of children: 1  . Years of education: N/A   Occupational History  . 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 Smoking status: Never Smoker  . Smokeless tobacco: Never Used  . Alcohol use No     Comment: Rare  . Drug use: No  . Sexual activity: Not Currently    Birth control/ protection: None   Other Topics Concern  . Not on file   Social History Narrative   Single, 1 child, boy 3 y/o  dx w/ Asperger's  highly funtional               Family History  Problem Relation Age of Onset  . Hypertension Father   . Heart failure Father   . Heart disease Father 60       MI  . Colon polyps Father   . Alzheimer's disease  Mother   . Osteoporosis Sister   . Diabetes Other        mother side   . Colon cancer Other        father side  . Lymphoma Other        uncle, aunt F side   . Breast cancer Neg Hx      Allergies as of 11/05/2016      Reactions   Guaifenesin Er    Other reaction(s): Hallucinations   Midazolam Nausea And Vomiting      Medication List       Accurate as of 11/05/16  5:46 PM. Always use your most recent med list.          EPINEPHrine 0.3 mg/0.3 mL Soaj injection Commonly known as:  EPIPEN 2-PAK Inject 0.3 mLs (0.3 mg total) into the muscle once.   levothyroxine 100 MCG tablet Commonly known as:  SYNTHROID, LEVOTHROID Take 1 tablet (100 mcg total) by mouth daily before breakfast.   valACYclovir 500 MG tablet Commonly known as:  VALTREX Take 500 mg by mouth daily. As needed , genital herpes          Objective:   Physical Exam BP 118/68 (BP Location: Left Arm, Patient Position: Sitting, Cuff Size: Small)   Pulse 69   Temp 97.9 F (36.6 C) (Oral)   Resp 12   Ht 5\' 4"  (1.626 m)   Wt 167 lb 2 oz (75.8 kg)   SpO2 97%   BMI 28.69 kg/m   General:   Well developed, well nourished . NAD.  Neck: No  thyromegaly  HEENT:  Normocephalic . Face symmetric, atraumatic Lungs:  CTA B Normal respiratory effort, no intercostal retractions, no accessory muscle use. Heart: RRR,  no murmur.  No pretibial edema bilaterally  Abdomen:  Not distended, soft, non-tender. No rebound or rigidity.   Skin: Exposed areas without rash. Not pale. Not jaundice Neurologic:  alert & oriented X3.  Speech normal, gait appropriate for age and unassisted Strength symmetric and appropriate for age.  Psych: Cognition and judgment appear intact.  Cooperative with normal attention span and concentration.  Behavior appropriate. No anxious or depressed appearing.    Assessment & Plan:    Assessment Hypothyroidism Genital herpes, Valtrex Perimenopausal  Peanuts allergy-- epipen BCC, Moh's   surgery 2009, dermatology DOE, chest pain: CT Chest (-)  PE, normal stress echo, saw pulmonary  PLAN Hypothyroidism: Last TSH 0.47, states that previously she felt the best when the TSH was ~1.0, we agreed to decrease Synthroid from 112 mcg to 100 mcg and recheck a TFTs in 6 weeks. Fatigue: All recent labs okay, no anxiety or depression per se, no sleep apnea on clinical grounds. Rx observation for now. Perimenopausal: Currently with no symptoms, recommend to discuss HRT with gynecology EpiPen RF RTC one year

## 2016-11-05 NOTE — Assessment & Plan Note (Addendum)
--  Td 2015 --CCS: Cscope x 2 ,  06-2005 (in [facility]) they found polyps Cscope 2008 normal.   cscope 02-2012, (-), next  5 years  --female care:  per gyn PAP per gyn; [facility]-- schedule for this week  DEXA @ gyn, ?osteopenia on vit  D supplements --Labs:  See results , reviewed w/ pt Diet-exercise discussed

## 2016-11-05 NOTE — Patient Instructions (Signed)
Take synthroid 100 mcg 1 a day, a lower dose  schedule labs to be done in 6 weeks  Next visit 1 year , physical exam

## 2016-11-05 NOTE — Progress Notes (Signed)
Pre visit review using our clinic review tool, if applicable. No additional management support is needed unless otherwise documented below in the visit note. 

## 2016-12-02 MED FILL — LEVOTHYROXINE 100 MCG TAB: 100 | 30 days supply | Qty: 30 | Fill #1

## 2016-12-04 MED FILL — AMOXICILLIN 875 MG TABLET: 875 | 10 days supply | Qty: 20 | Fill #0

## 2016-12-17 ENCOUNTER — Ambulatory Visit: Payer: 59 | Admitting: Internal Medicine

## 2016-12-17 ENCOUNTER — Other Ambulatory Visit (INDEPENDENT_AMBULATORY_CARE_PROVIDER_SITE_OTHER): Payer: 59

## 2016-12-17 ENCOUNTER — Other Ambulatory Visit: Payer: Self-pay | Admitting: Internal Medicine

## 2016-12-17 ENCOUNTER — Other Ambulatory Visit: Payer: Self-pay

## 2016-12-17 ENCOUNTER — Encounter: Payer: Self-pay | Admitting: Internal Medicine

## 2016-12-17 DIAGNOSIS — E039 Hypothyroidism, unspecified: Secondary | ICD-10-CM

## 2016-12-17 LAB — TSH: TSH: 0.68 u[IU]/mL (ref 0.35–4.50)

## 2016-12-17 LAB — T3, FREE: T3 FREE: 3.3 pg/mL (ref 2.3–4.2)

## 2016-12-17 LAB — T4, FREE: FREE T4: 0.72 ng/dL (ref 0.60–1.60)

## 2016-12-17 NOTE — Telephone Encounter (Signed)
Okay refill for one year

## 2016-12-17 NOTE — Telephone Encounter (Signed)
Pt is requesting refill on levothyroxine 141mcg daily. TFTs (thyroid function tests) completed today. Results are back. Okay to refill?

## 2016-12-19 MED ORDER — LEVOTHYROXINE SODIUM 88 MCG PO TABS: 88.0000 ug | ORAL_TABLET | Freq: Every day | ORAL | 1 refills | Status: DC

## 2016-12-19 NOTE — Telephone Encounter (Signed)
See MyChart message 12/17/2016.

## 2016-12-19 NOTE — Telephone Encounter (Signed)
Rx sent to [facility] pharmacy. TSH ordered to be completed in 6 weeks.

## 2016-12-19 NOTE — Telephone Encounter (Signed)
see below, send Synthroid 88 mcg #30, 2 RF, also future TSH, 6 weeks from now

## 2017-02-03 ENCOUNTER — Other Ambulatory Visit (INDEPENDENT_AMBULATORY_CARE_PROVIDER_SITE_OTHER): Payer: 59

## 2017-02-03 ENCOUNTER — Encounter: Payer: Self-pay | Admitting: Internal Medicine

## 2017-02-03 DIAGNOSIS — E039 Hypothyroidism, unspecified: Secondary | ICD-10-CM

## 2017-02-03 LAB — TSH: TSH: 5.35 u[IU]/mL — ABNORMAL HIGH (ref 0.35–4.50)

## 2017-02-06 MED ORDER — VALACYCLOVIR HCL 500 MG PO TABS: ORAL_TABLET | ORAL | 1 refills | Status: DC

## 2017-02-06 MED ORDER — LEVOTHYROXINE SODIUM 88 MCG PO TABS: ORAL_TABLET | ORAL | 1 refills | Status: DC

## 2017-02-06 NOTE — Telephone Encounter (Signed)
Message  Received: Today  Message Contents  Colon Branch, MD  Damita Dunnings, CMA        -Valtrex 500 mg 3 times a day for 3 days for each episode #90 , 1RF  -Synthroid 88 mg will be 1 tablet daily except take 1.5 tablet once a week.. 100, 1RF  Future TSH

## 2017-02-14 ENCOUNTER — Other Ambulatory Visit: Payer: Self-pay | Admitting: Internal Medicine

## 2017-03-14 ENCOUNTER — Telehealth: Payer: Self-pay

## 2017-03-14 ENCOUNTER — Encounter: Payer: Self-pay | Admitting: Internal Medicine

## 2017-03-14 ENCOUNTER — Other Ambulatory Visit (INDEPENDENT_AMBULATORY_CARE_PROVIDER_SITE_OTHER): Payer: 59

## 2017-03-14 DIAGNOSIS — E039 Hypothyroidism, unspecified: Secondary | ICD-10-CM | POA: Diagnosis not present

## 2017-03-14 LAB — TSH: TSH: 2.75 u[IU]/mL (ref 0.35–4.50)

## 2017-03-14 NOTE — Telephone Encounter (Signed)
Follow up call made to patient. States  the symptoms of blurred vision (this am), headaches and dizziness are intermittent. States she does not have shortness of breath, or chest pain. Patient feels that her Thyroid could be off and causing these symptoms. Advised patient that Tyroid levels were within normal range.

## 2017-03-14 NOTE — Telephone Encounter (Signed)
Contacted patient. Given results. Phone conversation notes sent to Dr. Larose Kells.

## 2017-03-14 NOTE — Telephone Encounter (Signed)
LMOM informing Pt to return call and schedule appt for next week to discuss symptoms, however if over weekend she becomes worse to seek medical attention at urgent care or ED.

## 2017-03-14 NOTE — Telephone Encounter (Signed)
Advise patient: if severe symptoms during the weekend needs to go to the ER otherwise please schedule a visit for next week to discuss. Thyroid disease well-balanced, continue same synthroid dose

## 2017-03-18 ENCOUNTER — Encounter: Payer: Self-pay | Admitting: Internal Medicine

## 2017-03-18 ENCOUNTER — Ambulatory Visit (INDEPENDENT_AMBULATORY_CARE_PROVIDER_SITE_OTHER): Payer: 59 | Admitting: Internal Medicine

## 2017-03-18 VITALS — BP 118/68 | HR 59 | Temp 97.9°F | Resp 14 | Ht 64.0 in | Wt 170.2 lb

## 2017-03-18 DIAGNOSIS — R42 Dizziness and giddiness: Secondary | ICD-10-CM

## 2017-03-18 DIAGNOSIS — E039 Hypothyroidism, unspecified: Secondary | ICD-10-CM | POA: Diagnosis not present

## 2017-03-18 DIAGNOSIS — R519 Headache, unspecified: Secondary | ICD-10-CM

## 2017-03-18 DIAGNOSIS — R51 Headache: Secondary | ICD-10-CM | POA: Diagnosis not present

## 2017-03-18 LAB — SEDIMENTATION RATE: Sed Rate: 1 mm/hr (ref 0–30)

## 2017-03-18 MED ORDER — MECLIZINE HCL 12.5 MG PO TABS: 12.5000 mg | ORAL_TABLET | Freq: Three times a day (TID) | ORAL | 0 refills | Status: DC | PRN

## 2017-03-18 NOTE — Progress Notes (Signed)
 Subjective:    Patient ID: Rachel Maynard, female    DOB: 07/06/1960, 56 y.o.   MRN: [id number]  DOS:  03/18/2017 Type of visit - description : acute Interval history: Not feeling well for the last 2-3  weeks: Dizziness whenever she moves her head or bends down, described as a spinning feeling. She also has headaches on and off, slightly more frequent and intense lately. Not the worst of her life. Located at different areas, temples, top of the head, temporal areas.    Review of Systems Denies fever chills No recent URI symptoms. No sinus pain or congestion No weight loss No slurred speech, diplopia or motor deficits  Past Medical History:  Diagnosis Date  . BCC (basal cell carcinoma of skin)     s/p [facility] 2009, sees derm  . Colon polyps   . Dyspnea    DOE- CP, 3-10 normal stress  ECHO, 4-10 CT neg for PE, saw pulmonary  . Genital herpes    On Valtrex  . Hypothyroidism     Past Surgical History:  Procedure Laterality Date  . APPENDECTOMY  1980  . c section  2012  . CHOLECYSTECTOMY  2004  . Uterine ablation      Social History   Social History  . Marital status: Single    Spouse name: N/A  . Number of children: 1  . Years of education: N/A   Occupational History  . Finances, sedentary [employer]   Social History Main Topics  . Smoking status: Never Smoker  . Smokeless tobacco: Never Used  . Alcohol use No     Comment: Rare  . Drug use: No  . Sexual activity: Not Currently    Birth control/ protection: None   Other Topics Concern  . Not on file   Social History Narrative   Single, 1 child, boy 16 y/o  dx w/ Asperger's  highly funtional                Allergies as of 03/18/2017      Reactions   Guaifenesin Er    Other reaction(s): Hallucinations   Midazolam Nausea And Vomiting      Medication List       Accurate as of 03/18/17 11:59 PM. Always use your most recent med list.          levothyroxine 100 MCG tablet Commonly known  as:  SYNTHROID, LEVOTHROID Take by mouth daily before breakfast. 1 tablet a day, except Tuesdays and Fridays take 1.5 tablets (800 mcg /week)   meclizine 12.5 MG tablet Commonly known as:  ANTIVERT Take 1-2 tablets (12.5-25 mg total) by mouth 3 (three) times daily as needed for dizziness.   valACYclovir 500 MG tablet Commonly known as:  VALTREX Take 1 tablet by mouth three times daily for 3 days for each outbreak            Discharge Care Instructions        Start     Ordered   03/18/17 0000  Sed Rate (ESR)     03/18/17 1109   03/18/17 0000  meclizine (ANTIVERT) 12.5 MG tablet  3 times daily PRN     03/18/17 1113   03/18/17 0000  TSH     03/18/17 1114         Objective:   Physical Exam BP 118/68 (BP Location: Left Arm, Patient Position: Sitting, Cuff Size: Small)   Pulse (!) 59   Temp 97.9 F (36.6 C) (  Oral)   Resp 14   Ht 5' 4" (1.626 m)   Wt 170 lb 4 oz (77.2 kg)   SpO2 97%   BMI 29.22 kg/m  General:   Well developed, well nourished . NAD.  HEENT:  Normocephalic . Face symmetric, atraumatic. TMs normal, throat no red. TMJ: No click Temples: Good pulses bilaterally, slightly tender on the right? Neck: Normal carotid pulses Lungs:  CTA B Normal respiratory effort, no intercostal retractions, no accessory muscle use. Heart: RRR,  no murmur.  No pretibial edema bilaterally  Skin: Not pale. Not jaundice Neurologic:  alert & oriented X3.  Speech normal, gait appropriate for age and unassisted Motor and DTRs symmetric. EOMI. Psych--  Cognition and judgment appear intact.  Cooperative with normal attention span and concentration.  Behavior appropriate. No anxious or depressed appearing.      Assessment & Plan:   Assessment Hypothyroidism Genital herpes, Valtrex Perimenopausal  Peanuts allergy-- epipen BCC, Moh's  surgery 2009, dermatology DOE, chest pain: CT Chest (-)  PE, normal stress echo, saw pulmonary  PLAN Dizziness: Likely a peripheral  issue, recommend rest, fluids, meclizine. Call if no better in 2 or 3 weeks, vestibular rehabilitation?. Headaches: Slightly more noticeable today, slightly TTP @ R temple. For completeness we'll recheck a sedimentation rate otherwise observation. Hypothyroidism: Currently on Synthroid 100 mg qd 1 time a week takes 1.5 tablets. TSH ~ 2.0, she felt better when TSH was below 1. Increase Synthroid 100 mg tablets : 1 qd except 1.5 twice a week. Check a TSH in 6 weeks   F2F > 26

## 2017-03-18 NOTE — Patient Instructions (Signed)
GO TO THE LAB : Get the blood work     GO TO THE FRONT DESK Schedule labs to be done 2 months from today (TSH)    Rest, drink plenty of fluids, take meclizine as needed for dizziness. Call if no better in few days

## 2017-03-18 NOTE — Progress Notes (Signed)
Pre visit review using our clinic review tool, if applicable. No additional management support is needed unless otherwise documented below in the visit note. 

## 2017-03-19 ENCOUNTER — Encounter: Payer: Self-pay | Admitting: Internal Medicine

## 2017-03-19 NOTE — Assessment & Plan Note (Signed)
Dizziness: Likely a peripheral issue, recommend rest, fluids, meclizine. Call if no better in 2 or 3 weeks, vestibular rehabilitation?. Headaches: Slightly more noticeable today, slightly TTP @ R temple. For completeness we'll recheck a sedimentation rate otherwise observation. Hypothyroidism: Currently on Synthroid 100 mg qd 1 time a week takes 1.5 tablets. TSH ~ 2.0, Rachel Maynard felt better when TSH was below 1. Increase Synthroid 100 mg tablets : 1 qd except 1.5 twice a week. Check a TSH in 6 weeks

## 2017-03-21 ENCOUNTER — Other Ambulatory Visit: Payer: Self-pay | Admitting: Internal Medicine

## 2017-04-18 LAB — HM COLONOSCOPY

## 2017-05-30 ENCOUNTER — Encounter: Payer: Self-pay | Admitting: Internal Medicine

## 2017-06-06 ENCOUNTER — Other Ambulatory Visit (INDEPENDENT_AMBULATORY_CARE_PROVIDER_SITE_OTHER): Payer: 59

## 2017-06-06 DIAGNOSIS — E039 Hypothyroidism, unspecified: Secondary | ICD-10-CM | POA: Diagnosis not present

## 2017-06-06 LAB — TSH: TSH: 3.98 u[IU]/mL (ref 0.35–4.50)

## 2017-06-06 NOTE — Addendum Note (Signed)
Addended byDamita Dunnings D on: 06/06/2017 01:41 PM   Modules accepted: Orders

## 2017-06-22 ENCOUNTER — Other Ambulatory Visit: Payer: Self-pay | Admitting: Internal Medicine

## 2017-06-23 MED ORDER — LEVOTHYROXINE SODIUM 112 MCG PO TABS: ORAL_TABLET | ORAL | 0 refills | Status: DC

## 2017-06-27 ENCOUNTER — Encounter: Payer: Self-pay | Admitting: Internal Medicine

## 2017-07-03 ENCOUNTER — Encounter (HOSPITAL_BASED_OUTPATIENT_CLINIC_OR_DEPARTMENT_OTHER): Payer: Self-pay | Admitting: *Deleted

## 2017-07-03 ENCOUNTER — Emergency Department (HOSPITAL_BASED_OUTPATIENT_CLINIC_OR_DEPARTMENT_OTHER)
Admission: EM | Admit: 2017-07-03 | Discharge: 2017-07-03 | Disposition: A | Discharge status: Home / Self Care | Payer: 59 | Attending: Emergency Medicine | Admitting: Emergency Medicine

## 2017-07-03 ENCOUNTER — Other Ambulatory Visit: Payer: Self-pay

## 2017-07-03 DIAGNOSIS — S0003XA Contusion of scalp, initial encounter: Secondary | ICD-10-CM

## 2017-07-03 DIAGNOSIS — M79672 Pain in left foot: Secondary | ICD-10-CM | POA: Diagnosis not present

## 2017-07-03 DIAGNOSIS — E039 Hypothyroidism, unspecified: Secondary | ICD-10-CM | POA: Diagnosis not present

## 2017-07-03 DIAGNOSIS — Z79899 Other long term (current) drug therapy: Secondary | ICD-10-CM | POA: Insufficient documentation

## 2017-07-03 DIAGNOSIS — Z85828 Personal history of other malignant neoplasm of skin: Secondary | ICD-10-CM | POA: Diagnosis not present

## 2017-07-03 DIAGNOSIS — Y9389 Activity, other specified: Secondary | ICD-10-CM | POA: Diagnosis not present

## 2017-07-03 DIAGNOSIS — Y92410 Unspecified street and highway as the place of occurrence of the external cause: Secondary | ICD-10-CM | POA: Insufficient documentation

## 2017-07-03 DIAGNOSIS — S0990XA Unspecified injury of head, initial encounter: Secondary | ICD-10-CM | POA: Diagnosis present

## 2017-07-03 DIAGNOSIS — M79671 Pain in right foot: Secondary | ICD-10-CM | POA: Insufficient documentation

## 2017-07-03 DIAGNOSIS — Y999 Unspecified external cause status: Secondary | ICD-10-CM | POA: Insufficient documentation

## 2017-07-03 DIAGNOSIS — M79662 Pain in left lower leg: Secondary | ICD-10-CM

## 2017-07-03 DIAGNOSIS — M79661 Pain in right lower leg: Secondary | ICD-10-CM

## 2017-07-03 MED ORDER — NAPROXEN 250 MG PO TABS
500.0000 mg | ORAL_TABLET | Freq: Once | ORAL | Status: AC
  Administered 2017-07-03: 500 mg via ORAL
  Filled 2017-07-03: qty 2

## 2017-07-03 MED ORDER — IBUPROFEN 400 MG PO TABS
400.0000 mg | ORAL_TABLET | Freq: Once | ORAL | Status: AC
  Administered 2017-07-03: 400 mg via ORAL
  Filled 2017-07-03: qty 1
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
## 2017-07-07 ENCOUNTER — Ambulatory Visit: Payer: 59 | Admitting: Internal Medicine

## 2017-07-07 ENCOUNTER — Ambulatory Visit: Payer: 59 | Admitting: Family Medicine

## 2017-07-07 ENCOUNTER — Encounter: Payer: Self-pay | Admitting: Family Medicine

## 2017-07-07 ENCOUNTER — Ambulatory Visit (HOSPITAL_BASED_OUTPATIENT_CLINIC_OR_DEPARTMENT_OTHER)
Admission: RE | Admit: 2017-07-07 | Discharge: 2017-07-07 | Disposition: A | Discharge status: Home / Self Care | Payer: 59 | Source: Ambulatory Visit | Attending: Internal Medicine | Admitting: Internal Medicine

## 2017-07-07 ENCOUNTER — Encounter: Payer: Self-pay | Admitting: Internal Medicine

## 2017-07-07 DIAGNOSIS — G44319 Acute post-traumatic headache, not intractable: Secondary | ICD-10-CM

## 2017-07-07 DIAGNOSIS — S46012A Strain of muscle(s) and tendon(s) of the rotator cuff of left shoulder, initial encounter: Secondary | ICD-10-CM | POA: Diagnosis not present

## 2017-07-07 DIAGNOSIS — S86819A Strain of other muscle(s) and tendon(s) at lower leg level, unspecified leg, initial encounter: Secondary | ICD-10-CM

## 2017-07-07 NOTE — Patient Instructions (Signed)
STOP BY THE FIRST FLOOR:  get the CT     keep hydrated  Tylenol  500 mg OTC 2 tabs a day every 8 hours as needed for pain  Call if not gradually back to normal in the next 10 days  Call or go to the ER with any alarming symptoms such as increased headache, nausea, confusion, double vision, neck pain.    Concussion, Adult A concussion is a brain injury from a direct hit (blow) to the head or body. This injury causes the brain to shake quickly back and forth inside the skull. It is caused by:  A hit to the head.  A quick and sudden movement (jolt) of the head or neck.  How fast you will get better from a concussion depends on many things like how bad your concussion was, what part of your brain was hurt, how old you are, and how healthy you were before the concussion. Recovery can take time. It is important to wait to return to activity until a doctor says it is safe and your symptoms are all gone. Follow these instructions at home: Activity  Limit activities that need a lot of thought or concentration. These include: ? Homework or work for your job. ? Watching TV. ? Computer work. ? Playing memory games and puzzles.  Rest. Rest helps the brain to heal. Make sure you: ? Get plenty of sleep at night. Do not stay up late. ? Go to bed at the same time every day. ? Rest during the day. Take naps or rest breaks when you feel tired.  It can be dangerous if you get another concussion before the first one has healed Do not do activities that could cause a second concussion, such as riding a bike or playing sports.  Ask your doctor when you can return to your normal activities, like driving, riding a bike, or using machinery. Your ability to react may be slower. Do not do these activities if you are dizzy. Your doctor will likely give you a plan for slowly going back to activities. General instructions  Take over-the-counter and prescription medicines only as told by your doctor.  Do  not drink alcohol until your doctor says you can.  If it is harder than usual to remember things, write them down.  If you are easily distracted, try to do one thing at a time. For example, do not try to watch TV while making dinner.  Talk with family members or close friends when you need to make important decisions.  Watch your symptoms and tell other people to do the same. Other problems (complications) can happen after a concussion. Older adults with a brain injury may have a higher risk of serious problems, such as a blood clot in the brain.  Tell your teachers, school nurse, school counselor, coach, Product/process development scientist, or work Freight forwarder about your injury and symptoms. Tell them about what you can or cannot do. They should watch for: ? More problems with attention or concentration. ? More trouble remembering or learning new information. ? More time needed to do tasks or assignments. ? Being more annoyed (irritable) or having a harder time dealing with stress. ? Any other symptoms that get worse.  Keep all follow-up visits as told by your health care provider. This is important. Prevention  It is very important that you donot get another brain injury, especially before you have healed. In rare cases, another injury can cause permanent brain damage, brain swelling, or  death. You have the most risk if you get another head injury in the first 7-10 days after you were hurt before. To avoid injuries: ? Wear a seat belt when you ride in a car. ? Do not drink too much alcohol. ? Avoid activities that could make you get a second concussion, like contact sports. ? Wear a helmet when you do activities like:  Biking.  Skiing.  Skateboarding.  Skating. ? Make your home safe by:  Removing things from the floor or stairs that could make you trip.  Using grab bars in bathrooms and handrails by stairs.  Placing non-slip mats on floors and in bathtubs.  Putting more light in dark  areas. Contact a doctor if:  Your symptoms get worse.  You have new symptoms.  You keep having symptoms for more than 2 weeks. Get help right away if:  You have bad headaches, or your headaches get worse.  You have weakness in any part of your body.  You have loss of feeling (numbness).  You feel off balance.  You keep throwing up (vomiting).  You feel more sleepy.  The black center of one eye (pupil) is bigger than the other one.  You twitch or shake violently (convulse) or have a seizure.  Your speech is not clear (is slurred).  You feel more tired, more confused, or more annoyed.  You do not recognize people or places.  You have neck pain.  It is hard to wake you up.  You have strange behavior changes.  You pass out (lose consciousness). Summary  A concussion is a brain injury from a direct hit (blow) to the head or body.  This condition is treated with rest and careful watching of symptoms.  If you keep having symptoms for more than 2 weeks, call your doctor. This information is not intended to replace advice given to you by your health care provider. Make sure you discuss any questions you have with your health care provider. Document Released: 05/29/2009 Document Revised: 05/25/2016 Document Reviewed: 05/25/2016 Elsevier Interactive Patient Education  2017 [employer].

## 2017-07-07 NOTE — Progress Notes (Signed)
Pre visit review using our clinic review tool, if applicable. No additional management support is needed unless otherwise documented below in the visit note. 
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
## 2017-07-07 NOTE — Patient Instructions (Signed)
You have a calf strain Compression sleeve or ace wrap to help with swelling and pain if tolerated. Icing for 15 minutes at a time 3-4 times a day Heel lifts either in temporary orthotics or on their own to prevent further strain. Crutches if needed Tylenol and/or aleve for pain. Start ankle range of motion exercises twice a day (up/down and alphabet exercises). When tolerated start theraband strengthening exercises. Advance to two legged calf raises, then one legged, then finally doing these on a step. Follow up with me in 4 weeks.  You also strained one of your rotator cuff muscles. I suspect this will get better over the next 2 weeks. Tylenol and/or aleve if needed for pain. Can consider rotator cuff exercises if this does not resolve on its own (3 sets of 10 with red theraband).

## 2017-07-08 ENCOUNTER — Encounter: Payer: Self-pay | Admitting: Family Medicine

## 2017-07-08 DIAGNOSIS — S46012D Strain of muscle(s) and tendon(s) of the rotator cuff of left shoulder, subsequent encounter: Secondary | ICD-10-CM | POA: Insufficient documentation

## 2017-07-08 DIAGNOSIS — S86819D Strain of other muscle(s) and tendon(s) at lower leg level, unspecified leg, subsequent encounter: Secondary | ICD-10-CM | POA: Insufficient documentation
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
## 2017-07-08 NOTE — Assessment & Plan Note (Signed)
expect to resolve over a couple weeks, mild.  Tylenol and/or aleve.  Reviewed home exercises if she's struggling with this.

## 2017-07-08 NOTE — Assessment & Plan Note (Signed)
grade 2 on right, 1 on left.  Compression sleeve or ace wrap.  Icing, heel lifts.  Tylenol and/or aleve if needed.  Start motion exercises.  Advance to strengthening and calf raises.  F/u in 4 weeks.
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
## 2017-08-04 ENCOUNTER — Ambulatory Visit: Payer: 59 | Admitting: Family Medicine

## 2017-08-04 ENCOUNTER — Encounter: Payer: Self-pay | Admitting: Family Medicine

## 2017-08-04 DIAGNOSIS — S46012D Strain of muscle(s) and tendon(s) of the rotator cuff of left shoulder, subsequent encounter: Secondary | ICD-10-CM

## 2017-08-04 DIAGNOSIS — S86819D Strain of other muscle(s) and tendon(s) at lower leg level, unspecified leg, subsequent encounter: Secondary | ICD-10-CM

## 2017-08-04 MED ORDER — NITROGLYCERIN 0.2 MG/HR TD PT24: MEDICATED_PATCH | TRANSDERMAL | 1 refills | Status: DC

## 2017-08-04 MED FILL — NITROGLYCERIN 0.2 MG/HR PAT: 0.2 | 30 days supply | Qty: 30 | Fill #0

## 2017-08-04 NOTE — Patient Instructions (Signed)
Continue with compression for your right calf. Advance strengthening exercises with this also from red band up to calf raises then single leg calf raises 3 sets of 10 once a day. Heel lifts if these provide you with comfort. Avoid hills, stairs as much as possible. Follow up with me in 6 weeks.  For your left shoulder use the red theraband and do the home exercises. Nitro patches 1/4th patch and change daily Tylenol and/or aleve if needed for pain. Consider MRI, physical therapy if not improving.

## 2017-08-05 ENCOUNTER — Encounter: Payer: Self-pay | Admitting: Family Medicine

## 2017-08-05 NOTE — Assessment & Plan Note (Signed)
discussed options - she would like to add nitro patches, continue home exercises with red theraband.  Tylenol and/or aleve.  Consider MRI, PT if not improving.
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
## 2017-08-05 NOTE — Assessment & Plan Note (Signed)
2/2 calf strains -  Persistent pain on right.  Continue with compression sleeve.  Advance strengthening exercises as tolerated.  Heel lifts.  Avoid hills, stairs.  F/u in 6 weeks.  Consider physical therapy.

## 2017-09-04 ENCOUNTER — Telehealth: Payer: Self-pay | Admitting: Family Medicine

## 2017-09-04 NOTE — Telephone Encounter (Signed)
Patient has an appointment scheduled on 3/25 but wants to know if an MRI of her left shoulder can be done before the follow up. She is still experiencing pain/discomfort.   If so, patient would like to go to the imaging center on [address]

## 2017-09-04 NOTE — Telephone Encounter (Signed)
This is ok with me  

## 2017-09-05 NOTE — Telephone Encounter (Signed)
Informed patient that we are working on the prior authorization with her insurance and that I will follow up with her on any updates

## 2017-09-08 ENCOUNTER — Telehealth: Payer: Self-pay | Admitting: Family Medicine

## 2017-09-08 NOTE — Telephone Encounter (Signed)
Patient is wanting to know when she will be getting her MRI, needs asap.

## 2017-09-10 ENCOUNTER — Other Ambulatory Visit: Payer: Self-pay | Admitting: *Deleted

## 2017-09-10 ENCOUNTER — Telehealth: Payer: Self-pay

## 2017-09-10 MED ORDER — LEVOTHYROXINE SODIUM 112 MCG PO TABS: ORAL_TABLET | ORAL | 0 refills | Status: DC

## 2017-09-10 NOTE — Telephone Encounter (Signed)
Received call from [facility] w/ [facility]- they needed clarification on levothyroxine dosage- informed Pt should be on 132mcg daily except for only 1/2 tablet once weekly. Authorized only 1 ninety day supply as we've actually been trying to contact Pt to come in and recheck her thyroid levels.

## 2017-09-10 NOTE — Progress Notes (Signed)
Faxed refill request received from [name] stating that they had Rx transferred from other pharmacy but had question on strength listed on prescription [Levothyroxine 88 mcg]; according to pt's EMR they take Levothyroxine 112 mcg, once daily w/exception of 0.5 tablet once weekly. Sent new Rx to new pharmacy with correct information and instructions electronically/SLS 03/20

## 2017-09-10 NOTE — Telephone Encounter (Signed)
Spoke to patient and told her waiting to hear back from insurance.

## 2017-09-15 ENCOUNTER — Ambulatory Visit: Payer: 59 | Admitting: Family Medicine

## 2017-09-16 ENCOUNTER — Encounter: Payer: Self-pay | Admitting: Family Medicine

## 2017-09-18 ENCOUNTER — Telehealth: Payer: Self-pay

## 2017-09-18 ENCOUNTER — Encounter: Payer: Self-pay | Admitting: Family Medicine

## 2017-09-18 NOTE — Telephone Encounter (Signed)
See note from 320/19 regarding medication verification. Kaylyn Dr. Ethel Rana CMA spoke with [facility] and patient.

## 2017-09-18 NOTE — Telephone Encounter (Signed)
Copied from [place]. Topic: General - Other >> Sep 09, 2017 10:27 AM Yvette Rack wrote: Brendolyn Patty from [facility] [phone] calling to get correct dosage on levothyroxine (SYNTHROID) 112 MCG tablet

## 2017-09-25 ENCOUNTER — Encounter: Payer: Self-pay | Admitting: Family Medicine

## 2017-10-07 NOTE — Telephone Encounter (Signed)
MRI was performed on 3/27 at [facility]

## 2017-11-04 ENCOUNTER — Encounter: Payer: Self-pay | Admitting: Family Medicine

## 2017-11-04 ENCOUNTER — Ambulatory Visit: Payer: 59 | Admitting: Family Medicine

## 2017-11-04 DIAGNOSIS — S46012D Strain of muscle(s) and tendon(s) of the rotator cuff of left shoulder, subsequent encounter: Secondary | ICD-10-CM

## 2017-11-04 MED ORDER — METHYLPREDNISOLONE ACETATE 40 MG/ML IJ SUSP
40.0000 mg | Freq: Once | INTRAMUSCULAR | Status: AC
  Administered 2017-11-04: 40 mg via INTRA_ARTICULAR

## 2017-11-04 NOTE — Patient Instructions (Signed)
You were given a subacromial injection today. Continue physical therapy and home exercises. Follow up with me in 6 weeks. Consider meloxicam, steroid dose pack if struggling despite the injection.

## 2017-11-05 ENCOUNTER — Encounter: Payer: Self-pay | Admitting: Family Medicine

## 2017-11-05 NOTE — Assessment & Plan Note (Signed)
2/2 strain, tendinopathy, synovitis seen on MRI.  Continue physical therapy and home exercises.  Subacromial injection given today.  F/u in 6 weeks.  Consider prednisone, meloxicam if still struggling.
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
## 2017-11-11 ENCOUNTER — Encounter: Payer: 59 | Admitting: Internal Medicine

## 2017-11-21 ENCOUNTER — Encounter: Payer: Self-pay | Admitting: Internal Medicine

## 2017-11-21 ENCOUNTER — Ambulatory Visit (INDEPENDENT_AMBULATORY_CARE_PROVIDER_SITE_OTHER): Payer: 59 | Admitting: Internal Medicine

## 2017-11-21 VITALS — BP 116/68 | HR 68 | Temp 97.8°F | Resp 14 | Ht 64.0 in | Wt 167.4 lb

## 2017-11-21 DIAGNOSIS — Z0000 Encounter for general adult medical examination without abnormal findings: Secondary | ICD-10-CM | POA: Diagnosis not present

## 2017-11-21 DIAGNOSIS — E039 Hypothyroidism, unspecified: Secondary | ICD-10-CM

## 2017-11-21 LAB — LIPID PANEL
Cholesterol: 170 mg/dL (ref 0–200)
HDL: 53.6 mg/dL (ref >39.00)
LDL CALC: 92 mg/dL (ref 0–99)
NONHDL: 116.14
Total CHOL/HDL Ratio: 3
Triglycerides: 122 mg/dL (ref 0.0–149.0)
VLDL: 24.4 mg/dL (ref 0.0–40.0)

## 2017-11-21 LAB — COMPREHENSIVE METABOLIC PANEL
ALT: 15 U/L (ref 0–35)
AST: 17 U/L (ref 0–37)
Albumin: 4.3 g/dL (ref 3.5–5.2)
Alkaline Phosphatase: 85 U/L (ref 39–117)
BUN: 15 mg/dL (ref 6–23)
CHLORIDE: 106 meq/L (ref 96–112)
CO2: 28 mEq/L (ref 19–32)
Calcium: 9.5 mg/dL (ref 8.4–10.5)
Creatinine, Ser: 0.81 mg/dL (ref 0.40–1.20)
GFR: 77.83 mL/min (ref >60.00)
GLUCOSE: 91 mg/dL (ref 70–99)
Potassium: 4.5 mEq/L (ref 3.5–5.1)
SODIUM: 141 meq/L (ref 135–145)
Total Bilirubin: 1.7 mg/dL — ABNORMAL HIGH (ref 0.2–1.2)
Total Protein: 7 g/dL (ref 6.0–8.3)

## 2017-11-21 LAB — TSH: TSH: 1.58 u[IU]/mL (ref 0.35–4.50)

## 2017-11-21 NOTE — Patient Instructions (Signed)
GO TO THE LAB : Get the blood work     GO TO THE FRONT DESK Schedule your next appointment for a physical exam in 1 year  Likely will have to check your thyroid in few months.
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
## 2017-11-21 NOTE — Progress Notes (Signed)
Pre visit review using our clinic review tool, if applicable. No additional management support is needed unless otherwise documented below in the visit note. 

## 2017-11-21 NOTE — Assessment & Plan Note (Addendum)
--  Td 2015 --CCS: Cscope x 2 ,  06-2005 (in [facility]) they found polyps Cscope 2008 normal.   cscope 02-2012, (-) cscope 2018, Dr Norville Haggard , told ok --female care:  MMG schedule for next week; PAP 2017 ;DEXA @ gyn, ?osteopenia on vit  D supplements --Labs: CMP, FLP, TSH (no fasting) --Diet-exercise discussed

## 2017-11-23 NOTE — Assessment & Plan Note (Signed)
Hypothyroidism: On Synthroid, 1 tablet daily.  Checking a TSH, RF with results Shoulder pain: Follow-up by sports medicine She sees dermatology regularly CPX today.   RTC 1 year

## 2017-11-24 MED ORDER — LEVOTHYROXINE SODIUM 112 MCG PO TABS: 112.0000 ug | ORAL_TABLET | Freq: Every day | ORAL | 3 refills | Status: DC

## 2017-11-24 NOTE — Addendum Note (Signed)
Addended byDamita Dunnings D on: 11/24/2017 07:39 AM   Modules accepted: Orders

## 2017-11-25 ENCOUNTER — Encounter: Payer: Self-pay | Admitting: Internal Medicine

## 2017-11-25 LAB — HM MAMMOGRAPHY

## 2018-01-05 ENCOUNTER — Ambulatory Visit: Payer: 59 | Admitting: Family Medicine

## 2018-01-05 ENCOUNTER — Encounter: Payer: Self-pay | Admitting: Family Medicine

## 2018-01-05 DIAGNOSIS — S46012D Strain of muscle(s) and tendon(s) of the rotator cuff of left shoulder, subsequent encounter: Secondary | ICD-10-CM

## 2018-01-05 NOTE — Patient Instructions (Signed)
Your ultrasound and exam are reassuring. You're getting tricep spasms. There's a possibility you have a labral tear but the pain you're getting is primarily in the tricep now. Stop the therapy Let me know how you're doing in 2-3 weeks. I may even have you come in for a no charge visit just to recheck how your exam is if you're still struggling despite rest. Orthopedic referral is a consideration.

## 2018-01-06 ENCOUNTER — Ambulatory Visit: Payer: 59 | Admitting: Family Medicine

## 2018-01-06 ENCOUNTER — Encounter: Payer: Self-pay | Admitting: Family Medicine
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
## 2018-01-06 NOTE — Assessment & Plan Note (Signed)
2/2 strain, tendinopathy, synovitis on MRI though current exam is reassuring.  More consistent with proximal triceps strain and spasms.  Advised we stop therapy for now and reviewed tricep strengthening.  Would reasses in about 2-3 weeks to see if she's getting improvement.  Can consider ortho referral but today's exam is not indicative that arthroscopy with debridement, acromioplasty, DCE would benefit her.  Tylenol if needed.

## 2018-01-14 ENCOUNTER — Encounter: Payer: Self-pay | Admitting: Internal Medicine

## 2018-01-14 ENCOUNTER — Encounter: Payer: Self-pay | Admitting: Family Medicine

## 2018-03-31 LAB — HM PAP SMEAR

## 2018-05-04 ENCOUNTER — Other Ambulatory Visit: Payer: Self-pay | Admitting: Internal Medicine

## 2018-05-04 ENCOUNTER — Encounter: Payer: Self-pay | Admitting: Internal Medicine

## 2018-05-05 ENCOUNTER — Telehealth: Payer: Self-pay

## 2018-05-05 MED ORDER — VALACYCLOVIR HCL 1 G PO TABS: 500.0000 mg | ORAL_TABLET | Freq: Three times a day (TID) | ORAL | 1 refills | Status: DC

## 2018-05-05 NOTE — Telephone Encounter (Signed)
Okay, change to Valtrex 1000 mg: Half tablet 3 times a day for 3 days as needed for outbreaks. Let the patient know that we are changing it. #90, 1 RF

## 2018-05-05 NOTE — Telephone Encounter (Signed)
Please advise 

## 2018-05-05 NOTE — Telephone Encounter (Signed)
Copied from [place] [id number]. Topic: General - Other >> May 05, 2018 10:15 AM Carolyn Stare wrote:  Chinita Greenland with [facility] call to say pt insurance is only covering 2 tablets per day. Pharmacy said can change to 1 gram  and pt can take 1/2 tablet 3 tablets   valACYclovir (VALTREX) 500 MG tablet

## 2018-05-05 NOTE — Telephone Encounter (Signed)
Rx sent. MyChart message sent to Pt.  

## 2018-05-24 HISTORY — PX: SHOULDER ARTHROSCOPY: SHX128

## 2018-06-29 ENCOUNTER — Encounter: Payer: Self-pay | Admitting: Internal Medicine

## 2018-10-07 ENCOUNTER — Encounter: Payer: Self-pay | Admitting: Internal Medicine

## 2018-10-08 NOTE — Telephone Encounter (Signed)
Left pt a message to call back to schedule a virtual visit.

## 2018-10-29 ENCOUNTER — Encounter: Payer: 59 | Admitting: Internal Medicine

## 2018-11-04 ENCOUNTER — Encounter: Payer: Self-pay | Admitting: Internal Medicine

## 2018-11-09 ENCOUNTER — Encounter: Payer: Self-pay | Admitting: Internal Medicine

## 2018-11-09 ENCOUNTER — Telehealth: Payer: Self-pay | Admitting: Internal Medicine

## 2018-11-09 ENCOUNTER — Ambulatory Visit (INDEPENDENT_AMBULATORY_CARE_PROVIDER_SITE_OTHER): Payer: 59 | Admitting: Internal Medicine

## 2018-11-09 ENCOUNTER — Other Ambulatory Visit: Payer: Self-pay

## 2018-11-09 DIAGNOSIS — E039 Hypothyroidism, unspecified: Secondary | ICD-10-CM

## 2018-11-09 DIAGNOSIS — Z0000 Encounter for general adult medical examination without abnormal findings: Secondary | ICD-10-CM | POA: Diagnosis not present

## 2018-11-09 NOTE — Assessment & Plan Note (Signed)
Hypothyroidism: On Synthroid, checking labs Shoulder pain: Had surgery12-2019 for left shoulder rotator cuff tear capsulitis.  Recovering well with occasional biceps pain.  Plans to talk to surgery about remaining pain. COVID-19: Taking excellent precautions.  Works from home. Skin cancer: Sees dermatology yearly RTC 1 year

## 2018-11-09 NOTE — Progress Notes (Signed)
Subjective:    Patient ID: Rachel Maynard, female    DOB: January 14, 1961, 58 y.o.   MRN: [id number]  DOS:  11/09/2018 Type of visit - description: Virtual Visit via Video Note  I connected with@ on 11/09/18 at  8:40 AM EDT by a video enabled telemedicine application and verified that I am speaking with the correct person using two identifiers.   THIS ENCOUNTER IS A VIRTUAL VISIT DUE TO COVID-19 - PATIENT WAS NOT SEEN IN THE OFFICE. PATIENT HAS CONSENTED TO VIRTUAL VISIT / TELEMEDICINE VISIT   Location of patient: home  Location of provider: office  I discussed the limitations of evaluation and management by telemedicine and the availability of in person appointments. The patient expressed understanding and agreed to proceed.  History of Present Illness: Complete physical exam In general she is doing well. Had shoulder surgery, improving.  Review of Systems After surgery on the left shoulder, she has some remaining, on and off pain at the left biceps.  Plans to talk with orthopedic surgery COVID-19: Following all appropriate precautions Occasional hot flashes   Other than above, a 14 point review of systems is negative    Past Medical History:  Diagnosis Date  . BCC (basal cell carcinoma of skin)     s/p [facility] 2009, sees derm  . Colon polyps   . Dyspnea    DOE- CP, 3-10 normal stress  ECHO, 4-10 CT neg for PE, saw pulmonary  . Genital herpes    On Valtrex  . Hypothyroidism     Past Surgical History:  Procedure Laterality Date  . APPENDECTOMY  1980  . CESAREAN SECTION  2012  . CHOLECYSTECTOMY  2004  . SHOULDER ARTHROSCOPY Left 05/2018  . Uterine ablation      Social History   Socioeconomic History  . Marital status: Single    Spouse name: Not on file  . Number of children: 1  . Years of education: Not on file  . Highest education level: Not on file  Occupational History  . Occupation: Publishing rights manager, sedentary    Employer: [employer]  .  Financial resource strain: Not on file  . Food insecurity:    Worry: Not on file    Inability: Not on file  . Transportation needs:    Medical: Not on file    Non-medical: Not on file  Tobacco Use  . Smoking status: Never Smoker  . Smokeless tobacco: Never Used  Substance and Sexual Activity  . Alcohol use: No    Alcohol/week: 0.0 standard drinks    Comment: Rare  . Drug use: No  . Sexual activity: Not Currently    Birth control/protection: None  Lifestyle  . Physical activity:    Days per week: Not on file    Minutes per session: Not on file  . Stress: Not on file  Relationships  . Social connections:    Talks on phone: Not on file    Gets together: Not on file    Attends religious service: Not on file    Active member of club or organization: Not on file    Attends meetings of clubs or organizations: Not on file    Relationship status: Not on file  . Intimate partner violence:    Fear of current or ex partner: Not on file    Emotionally abused: Not on file    Physically abused: Not on file    Forced sexual activity: Not on file  Other  Topics Concern  . Not on file  Social History Narrative   Single, 1 child, boy 55 y/o  dx w/ Asperger's  highly funtional            Family History  Problem Relation Age of Onset  . Hypertension Father   . Heart failure Father   . Heart disease Father 80       MI  . Colon polyps Father   . Transient ischemic attack Father   . Alzheimer's disease Mother   . Osteoporosis Sister   . Diabetes Other        mother side   . Colon cancer Other        father side  . Lymphoma Other        uncle, aunt F side   . Breast cancer Neg Hx      Allergies as of 11/09/2018      Reactions   Guaifenesin Er    Other reaction(s): Hallucinations   Midazolam Nausea And Vomiting      Medication List       Accurate as of Nov 09, 2018  3:58 PM. If you have any questions, ask your nurse or doctor.        EpiPen 2-Pak 0.3 mg/0.3 mL Soaj  injection Generic drug:  EPINEPHrine Inject 0.3 mLs (0.3 mg total) into the muscle once as needed.   levothyroxine 112 MCG tablet Commonly known as:  Synthroid Take 1 tablet (112 mcg total) by mouth daily before breakfast.   valACYclovir 1000 MG tablet Commonly known as:  Valtrex Take 0.5 tablets (500 mg total) by mouth 3 (three) times daily. For 3 days per outbreak           Objective:   Physical Exam There were no vitals taken for this visit. This is a virtual video visit.  Alert oriented x3, no apparent distress     Assessment      Assessment Hypothyroidism Genital herpes, Valtrex Perimenopausal  Peanuts allergy-- epipen BCC, Moh's  surgery 2009,  Sees dermatology DOE, chest pain: CT Chest (-)  PE, normal stress echo, saw pulmonary  PLAN Hypothyroidism: On Synthroid, checking labs Shoulder pain: Had surgery12-2019 for left shoulder rotator cuff tear capsulitis.  Recovering well with occasional biceps pain.  Plans to talk to surgery about remaining pain. COVID-19: Taking excellent precautions.  Works from home. Skin cancer: Sees dermatology yearly RTC 1 year    I discussed the assessment and treatment plan with the patient. The patient was provided an opportunity to ask questions and all were answered. The patient agreed with the plan and demonstrated an understanding of the instructions.   The patient was advised to call back or seek an in-person evaluation if the symptoms worsen or if the condition fails to improve as anticipated.

## 2018-11-09 NOTE — Telephone Encounter (Signed)
Return in about 1 year (around 11/09/2019) for physical exam, fasting.   : Needs labs fasting done this week. CPX next year.Please to schedule   Called patient left msg to call back and schedule

## 2018-11-09 NOTE — Assessment & Plan Note (Addendum)
--  Td 2015; rec flu shot  --CCS: Cscope x 2 ,  06-2005 (in [facility]) they found polyps Cscope 2008 normal.   cscope 02-2012, (-) cscope 2018, Dr Norville Haggard , was told ok, no records, will try to get them --female care:  [facility]  PAP  DEXA @ gyn, ?osteopenia on vit  D supplements --Labs: CMP, FLP, CBC, TSH --Diet-exercise discussed

## 2018-11-11 ENCOUNTER — Encounter: Payer: Self-pay | Admitting: Internal Medicine

## 2018-11-18 ENCOUNTER — Other Ambulatory Visit (INDEPENDENT_AMBULATORY_CARE_PROVIDER_SITE_OTHER): Payer: 59

## 2018-11-18 ENCOUNTER — Telehealth: Payer: Self-pay | Admitting: Internal Medicine

## 2018-11-18 ENCOUNTER — Other Ambulatory Visit: Payer: Self-pay

## 2018-11-18 DIAGNOSIS — E039 Hypothyroidism, unspecified: Secondary | ICD-10-CM | POA: Diagnosis not present

## 2018-11-18 DIAGNOSIS — Z0000 Encounter for general adult medical examination without abnormal findings: Secondary | ICD-10-CM

## 2018-11-18 LAB — CBC WITH DIFFERENTIAL/PLATELET
Basophils Absolute: 0.1 10*3/uL (ref 0.0–0.1)
Basophils Relative: 1.1 % (ref 0.0–3.0)
Eosinophils Absolute: 0.2 10*3/uL (ref 0.0–0.7)
Eosinophils Relative: 3.1 % (ref 0.0–5.0)
HCT: 39.3 % (ref 36.0–46.0)
Hemoglobin: 13.9 g/dL (ref 12.0–15.0)
Lymphocytes Relative: 39.6 % (ref 12.0–46.0)
Lymphs Abs: 2.5 10*3/uL (ref 0.7–4.0)
MCHC: 35.4 g/dL (ref 30.0–36.0)
MCV: 91.1 fl (ref 78.0–100.0)
Monocytes Absolute: 0.4 10*3/uL (ref 0.1–1.0)
Monocytes Relative: 6.9 % (ref 3.0–12.0)
Neutro Abs: 3.2 10*3/uL (ref 1.4–7.7)
Neutrophils Relative %: 49.3 % (ref 43.0–77.0)
Platelets: 233 10*3/uL (ref 150.0–400.0)
RBC: 4.31 Mil/uL (ref 3.87–5.11)
RDW: 12.7 % (ref 11.5–15.5)
WBC: 6.4 10*3/uL (ref 4.0–10.5)

## 2018-11-18 LAB — LIPID PANEL
Cholesterol: 179 mg/dL (ref 0–200)
HDL: 52.5 mg/dL (ref >39.00)
LDL Cholesterol: 105 mg/dL — ABNORMAL HIGH (ref 0–99)
NonHDL: 126
Total CHOL/HDL Ratio: 3
Triglycerides: 105 mg/dL (ref 0.0–149.0)
VLDL: 21 mg/dL (ref 0.0–40.0)

## 2018-11-18 LAB — TSH: TSH: 0.14 u[IU]/mL — ABNORMAL LOW (ref 0.35–4.50)

## 2018-11-18 LAB — COMPREHENSIVE METABOLIC PANEL
ALT: 18 U/L (ref 0–35)
AST: 18 U/L (ref 0–37)
Albumin: 4.3 g/dL (ref 3.5–5.2)
Alkaline Phosphatase: 92 U/L (ref 39–117)
BUN: 14 mg/dL (ref 6–23)
CO2: 27 mEq/L (ref 19–32)
Calcium: 9.5 mg/dL (ref 8.4–10.5)
Chloride: 106 mEq/L (ref 96–112)
Creatinine, Ser: 0.62 mg/dL (ref 0.40–1.20)
GFR: 99.33 mL/min (ref >60.00)
Glucose, Bld: 84 mg/dL (ref 70–99)
Potassium: 4.5 mEq/L (ref 3.5–5.1)
Sodium: 141 mEq/L (ref 135–145)
Total Bilirubin: 1.6 mg/dL — ABNORMAL HIGH (ref 0.2–1.2)
Total Protein: 6.6 g/dL (ref 6.0–8.3)

## 2018-11-18 NOTE — Telephone Encounter (Signed)
Needs nurse visit to check BP, pulse, weight and height for form. Please schedule at her convenience.

## 2018-11-18 NOTE — Telephone Encounter (Signed)
Pt dropped off document to be filled out by provider [facility] Physical 1 page front and back) Pt would like to be called at [phone] when ready. Document put at front office tray under providers name.

## 2018-11-20 ENCOUNTER — Telehealth: Payer: Self-pay | Admitting: Internal Medicine

## 2018-11-20 MED ORDER — LEVOTHYROXINE SODIUM 100 MCG PO TABS: 100.0000 ug | ORAL_TABLET | Freq: Every day | ORAL | 5 refills | Status: DC

## 2018-11-20 NOTE — Telephone Encounter (Signed)
Left msg for PT to call back for a 2 week lab appt

## 2018-11-20 NOTE — Addendum Note (Signed)
Addended byDamita Dunnings D on: 11/20/2018 07:56 AM   Modules accepted: Orders

## 2018-11-21 ENCOUNTER — Encounter: Payer: Self-pay | Admitting: Internal Medicine

## 2018-11-27 ENCOUNTER — Other Ambulatory Visit: Payer: Self-pay

## 2018-11-27 ENCOUNTER — Ambulatory Visit (INDEPENDENT_AMBULATORY_CARE_PROVIDER_SITE_OTHER): Payer: 59 | Admitting: Internal Medicine

## 2018-11-27 ENCOUNTER — Encounter: Payer: Self-pay | Admitting: Internal Medicine

## 2018-11-27 VITALS — BP 131/78 | HR 72 | Resp 16 | Ht 64.0 in | Wt 169.4 lb

## 2018-11-27 DIAGNOSIS — E039 Hypothyroidism, unspecified: Secondary | ICD-10-CM

## 2018-11-27 NOTE — Progress Notes (Signed)
Pt here today for bp check and height/weight for physical form.   BP: 131/78 P: 72 Wt: 169lb6oz  Ht: 5'4''   Original form completed and given back to Pt. Copy of form sent for scanning.   Kathlene November, MD

## 2018-11-27 NOTE — Telephone Encounter (Signed)
Form completed. Original form given back to Pt, copy of form sent for scanning.

## 2018-11-27 NOTE — Telephone Encounter (Signed)
Pt coming today at 2pm for BP and weight check for completion of form.

## 2018-12-10 ENCOUNTER — Other Ambulatory Visit: Payer: Self-pay

## 2018-12-10 ENCOUNTER — Other Ambulatory Visit (INDEPENDENT_AMBULATORY_CARE_PROVIDER_SITE_OTHER): Payer: 59

## 2018-12-10 DIAGNOSIS — E039 Hypothyroidism, unspecified: Secondary | ICD-10-CM

## 2018-12-10 LAB — TSH: TSH: 0.16 u[IU]/mL — ABNORMAL LOW (ref 0.35–4.50)

## 2018-12-11 IMAGING — DX DG LUMBAR SPINE 2-3V
3 series · 3 of 3 positions shown · non-contrast
Comparison: None.

CLINICAL DATA: Right low back pain x4 days

EXAM:
LUMBAR SPINE - 2-3 VIEW

[l-spine ap]
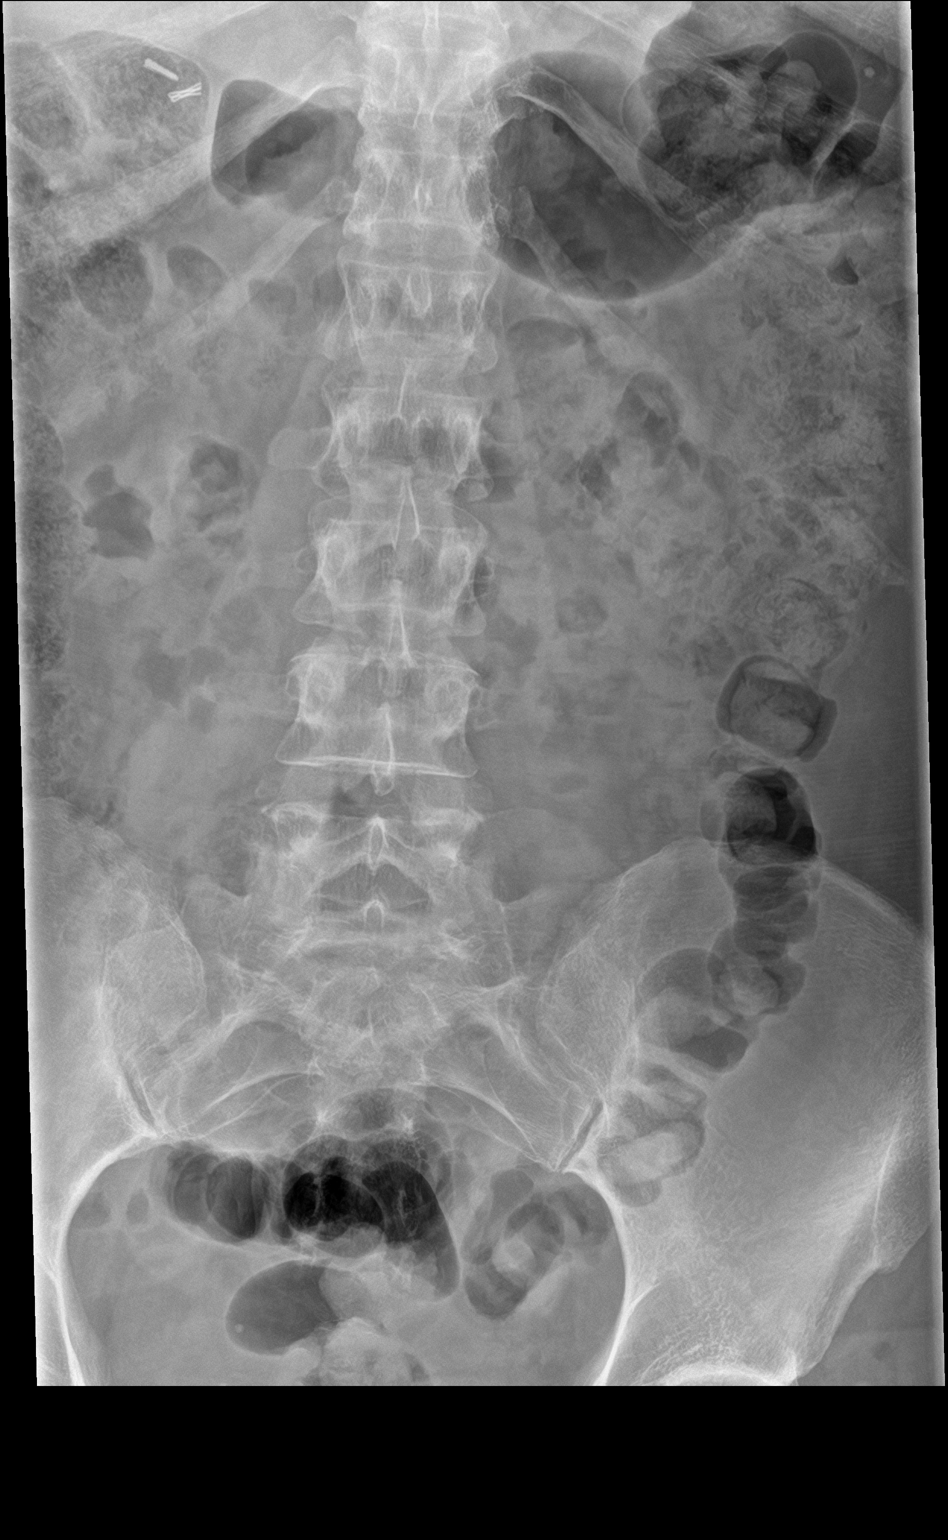

[l-spine lat]
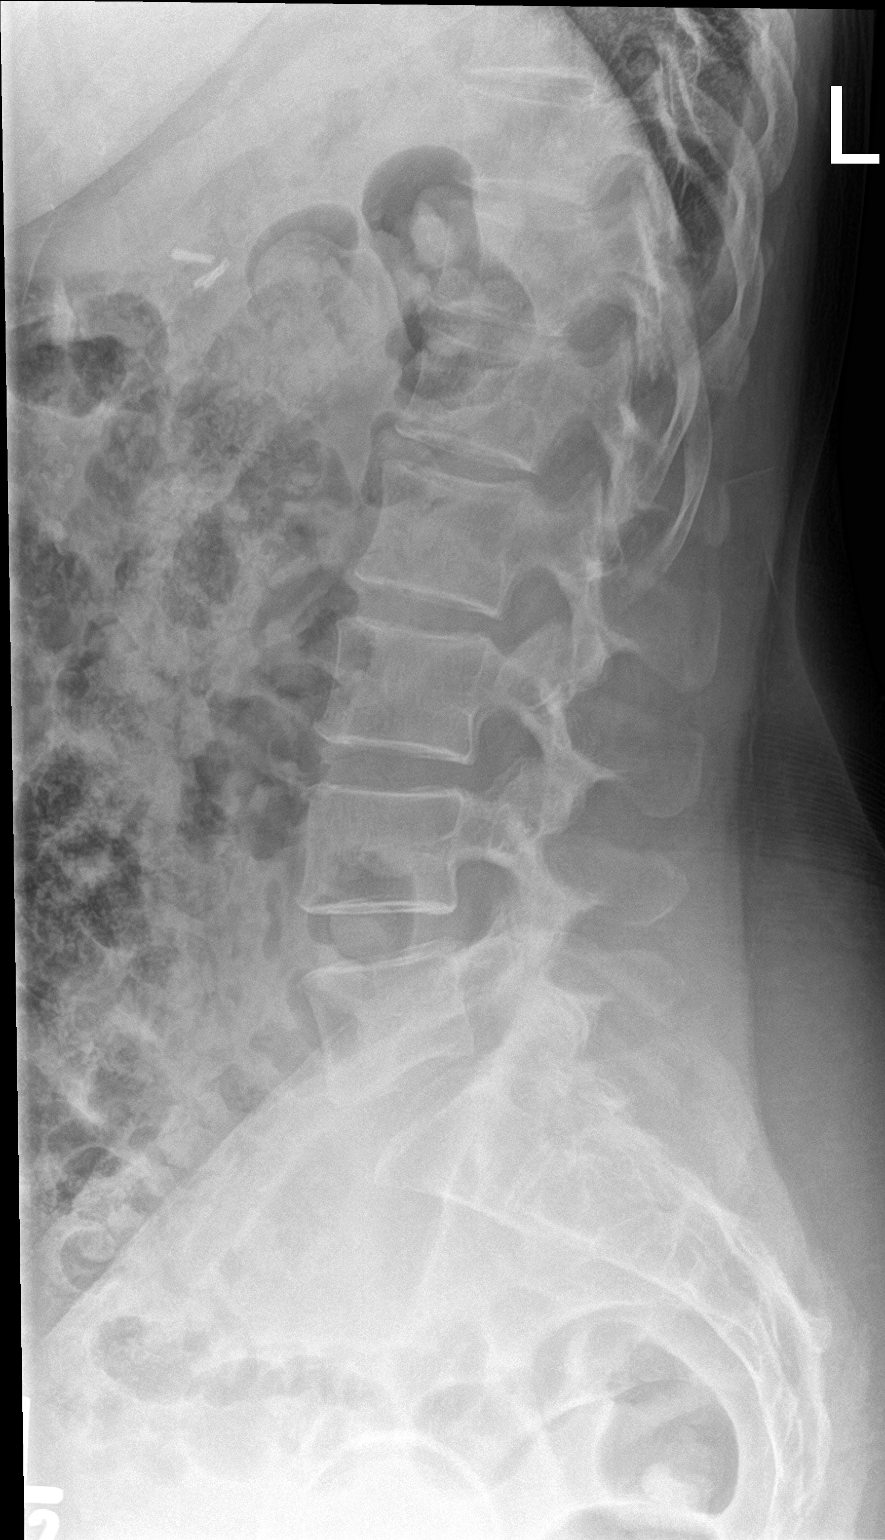

[l-spine spot]
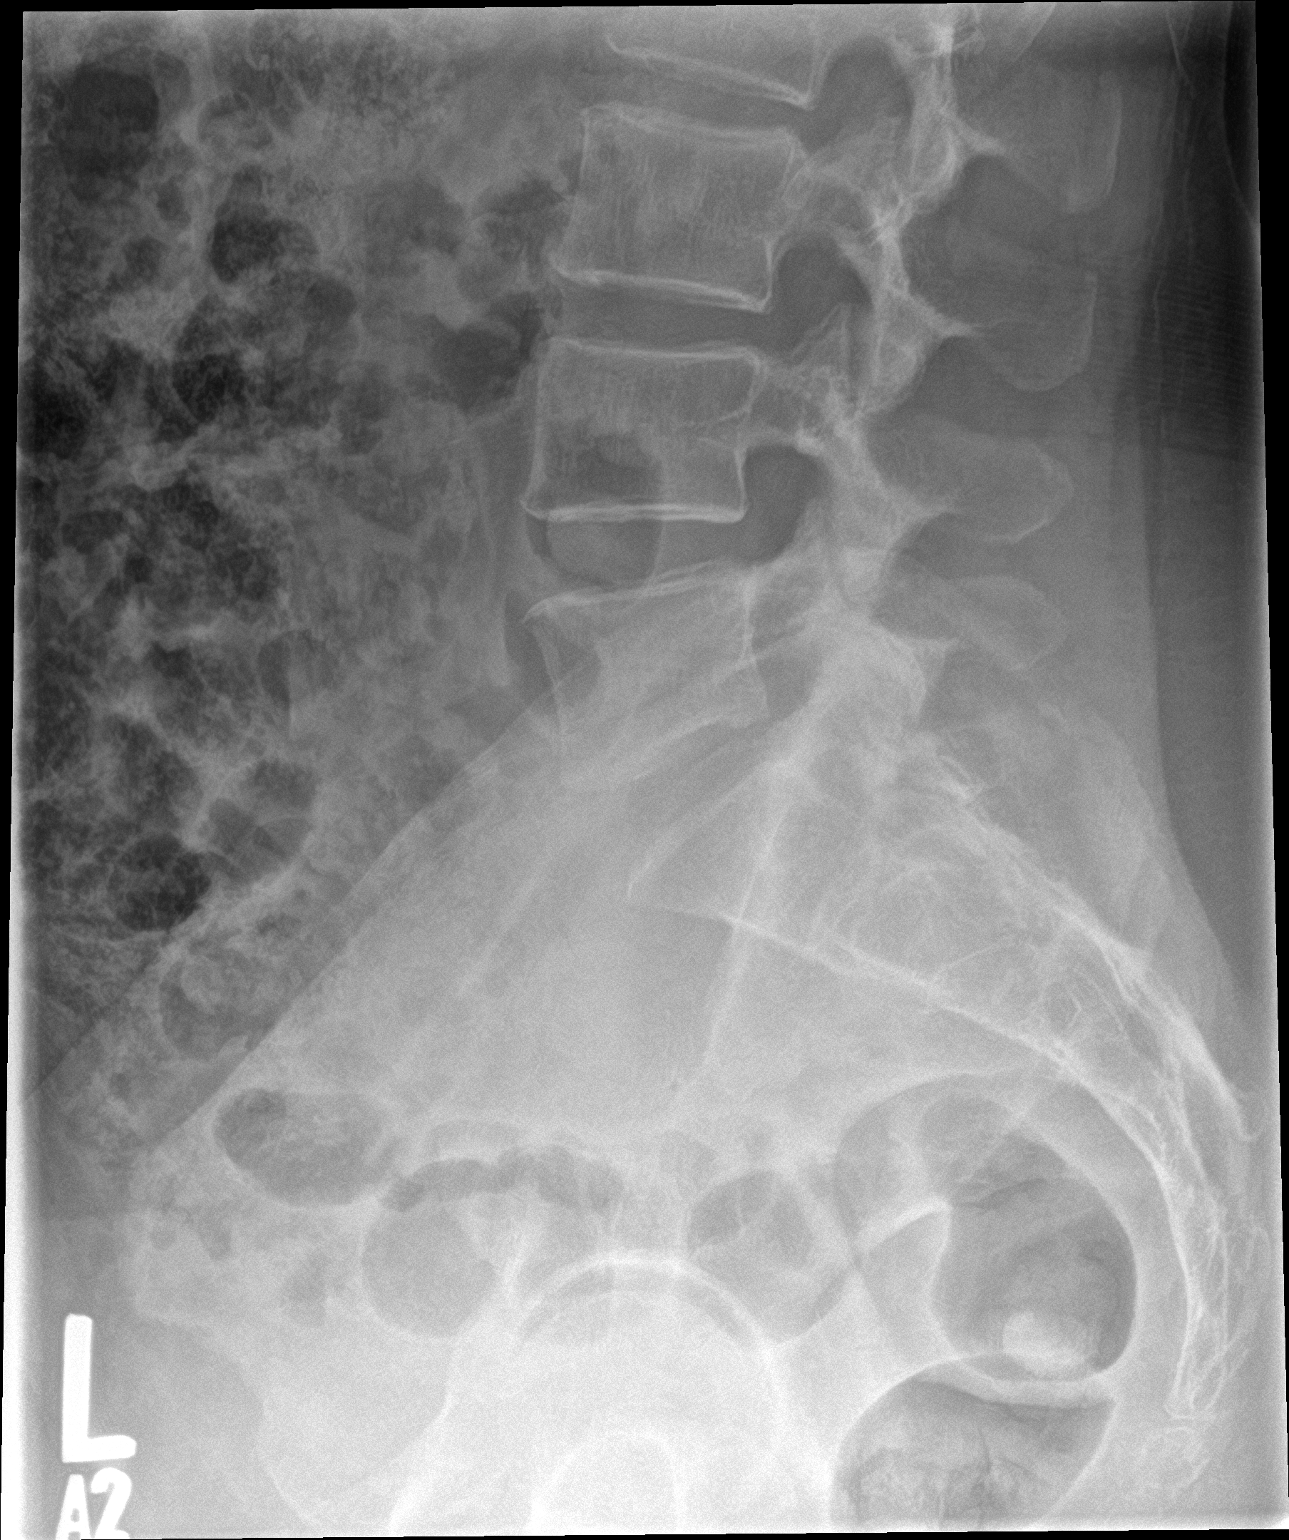

[3 of 3 positions shown; findings below may reference images not displayed]

FINDINGS: There is no evidence of lumbar spine fracture. Alignment is normal.
Intervertebral disc spaces are maintained. Mild facet DJD
bilaterally L5-S1. Cholecystectomy clips.
IMPRESSION: 1. Negative for fracture or other acute bone abnormality.
2. Mild facet DJD L5-S1.

## 2018-12-11 MED ORDER — LEVOTHYROXINE SODIUM 88 MCG PO TABS: 88.0000 ug | ORAL_TABLET | Freq: Every day | ORAL | 3 refills | Status: DC

## 2018-12-11 NOTE — Addendum Note (Signed)
Addended byDamita Dunnings D on: 12/11/2018 01:09 PM   Modules accepted: Orders

## 2018-12-28 ENCOUNTER — Encounter: Payer: Self-pay | Admitting: Internal Medicine

## 2019-02-10 ENCOUNTER — Other Ambulatory Visit: Payer: Self-pay

## 2019-02-10 ENCOUNTER — Telehealth: Payer: Self-pay | Admitting: Internal Medicine

## 2019-02-10 ENCOUNTER — Encounter: Payer: Self-pay | Admitting: Internal Medicine

## 2019-02-10 DIAGNOSIS — E039 Hypothyroidism, unspecified: Secondary | ICD-10-CM

## 2019-02-10 NOTE — Telephone Encounter (Signed)
-----   Message from Colon Branch, MD sent at 02/10/2019  2:00 PM EDT ----- Regarding: TSH Please enter the order Sherri: Needs labs this week.  Please arrange

## 2019-02-10 NOTE — Telephone Encounter (Signed)
Called patient left msg

## 2019-02-15 ENCOUNTER — Other Ambulatory Visit: Payer: Self-pay

## 2019-02-15 ENCOUNTER — Other Ambulatory Visit (INDEPENDENT_AMBULATORY_CARE_PROVIDER_SITE_OTHER): Payer: 59

## 2019-02-15 DIAGNOSIS — E039 Hypothyroidism, unspecified: Secondary | ICD-10-CM | POA: Diagnosis not present

## 2019-02-16 LAB — TSH: TSH: 3.31 u[IU]/mL (ref 0.35–4.50)

## 2019-02-16 MED ORDER — LEVOTHYROXINE SODIUM 88 MCG PO TABS: 88.0000 ug | ORAL_TABLET | Freq: Every day | ORAL | 1 refills | Status: DC

## 2019-02-16 NOTE — Addendum Note (Signed)
Addended byDamita Dunnings D on: 02/16/2019 10:49 AM   Modules accepted: Orders

## 2019-04-11 LAB — NOVEL CORONAVIRUS, NAA: SARS-CoV-2, NAA: POSITIVE

## 2019-04-18 ENCOUNTER — Encounter: Payer: Self-pay | Admitting: Internal Medicine

## 2019-04-19 ENCOUNTER — Encounter: Payer: Self-pay | Admitting: Internal Medicine

## 2019-04-23 ENCOUNTER — Encounter: Payer: Self-pay | Admitting: Internal Medicine

## 2019-07-13 ENCOUNTER — Encounter: Payer: Self-pay | Admitting: Internal Medicine

## 2019-07-16 ENCOUNTER — Other Ambulatory Visit: Payer: Self-pay

## 2019-07-16 ENCOUNTER — Encounter: Payer: Self-pay | Admitting: Internal Medicine

## 2019-07-16 ENCOUNTER — Ambulatory Visit (INDEPENDENT_AMBULATORY_CARE_PROVIDER_SITE_OTHER): Payer: 59 | Admitting: Internal Medicine

## 2019-07-16 VITALS — BP 116/66 | HR 84 | Temp 97.7°F | Resp 16 | Ht 64.0 in | Wt 168.4 lb

## 2019-07-16 DIAGNOSIS — E039 Hypothyroidism, unspecified: Secondary | ICD-10-CM | POA: Diagnosis not present

## 2019-07-16 NOTE — Progress Notes (Signed)
Pre visit review using our clinic review tool, if applicable. No additional management support is needed unless otherwise documented below in the visit note. 
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
## 2019-07-17 LAB — TSH: TSH: 5.9 mIU/L — ABNORMAL HIGH (ref 0.40–4.50)

## 2019-07-17 NOTE — Assessment & Plan Note (Signed)
Abnormal skull?  Exam is benign, recommend observation Hypothyroidism: Due for TSH, good med compliance. Follow-up as a schedule on May 2021

## 2019-07-19 MED ORDER — LEVOTHYROXINE SODIUM 100 MCG PO TABS: 100.0000 ug | ORAL_TABLET | Freq: Every day | ORAL | 0 refills | Status: DC

## 2019-07-19 NOTE — Addendum Note (Signed)
Addended byDamita Dunnings D on: 07/19/2019 08:58 AM   Modules accepted: Orders

## 2019-09-19 ENCOUNTER — Telehealth: Payer: Self-pay | Admitting: Internal Medicine

## 2019-09-19 NOTE — Telephone Encounter (Signed)
Please call patient and arrange a lab appointment for TSH within the next few days.

## 2019-09-20 NOTE — Telephone Encounter (Signed)
Called Rachel Maynard Left msg to call in for an appt for lab.

## 2019-09-21 ENCOUNTER — Encounter: Payer: Self-pay | Admitting: Internal Medicine

## 2019-09-22 ENCOUNTER — Other Ambulatory Visit (INDEPENDENT_AMBULATORY_CARE_PROVIDER_SITE_OTHER): Payer: 59

## 2019-09-22 ENCOUNTER — Other Ambulatory Visit: Payer: Self-pay

## 2019-09-22 DIAGNOSIS — E039 Hypothyroidism, unspecified: Secondary | ICD-10-CM

## 2019-09-22 LAB — TSH: TSH: 2.31 u[IU]/mL (ref 0.35–4.50)

## 2019-09-22 LAB — T4, FREE: Free T4: 0.85 ng/dL (ref 0.60–1.60)

## 2019-09-22 LAB — T3, FREE: T3, Free: 3.3 pg/mL (ref 2.3–4.2)

## 2019-10-22 ENCOUNTER — Other Ambulatory Visit: Payer: Self-pay

## 2019-10-22 MED ORDER — LEVOTHYROXINE SODIUM 100 MCG PO TABS: 100.0000 ug | ORAL_TABLET | Freq: Every day | ORAL | 1 refills | Status: DC

## 2019-11-10 ENCOUNTER — Ambulatory Visit (INDEPENDENT_AMBULATORY_CARE_PROVIDER_SITE_OTHER): Payer: 59 | Admitting: Internal Medicine

## 2019-11-10 ENCOUNTER — Encounter: Payer: Self-pay | Admitting: Internal Medicine

## 2019-11-10 ENCOUNTER — Other Ambulatory Visit: Payer: Self-pay

## 2019-11-10 VITALS — BP 117/57 | HR 59 | Temp 96.3°F | Resp 18 | Ht 64.0 in | Wt 159.2 lb

## 2019-11-10 DIAGNOSIS — Z0000 Encounter for general adult medical examination without abnormal findings: Secondary | ICD-10-CM

## 2019-11-10 DIAGNOSIS — E039 Hypothyroidism, unspecified: Secondary | ICD-10-CM

## 2019-11-10 LAB — CBC WITH DIFFERENTIAL/PLATELET
Basophils Absolute: 0.1 10*3/uL (ref 0.0–0.1)
Basophils Relative: 1.3 % (ref 0.0–3.0)
Eosinophils Absolute: 0.1 10*3/uL (ref 0.0–0.7)
Eosinophils Relative: 2.5 % (ref 0.0–5.0)
HCT: 38.8 % (ref 36.0–46.0)
Hemoglobin: 13.9 g/dL (ref 12.0–15.0)
Lymphocytes Relative: 35.4 % (ref 12.0–46.0)
Lymphs Abs: 2 10*3/uL (ref 0.7–4.0)
MCHC: 35.8 g/dL (ref 30.0–36.0)
MCV: 90.3 fl (ref 78.0–100.0)
Monocytes Absolute: 0.3 10*3/uL (ref 0.1–1.0)
Monocytes Relative: 6 % (ref 3.0–12.0)
Neutro Abs: 3.1 10*3/uL (ref 1.4–7.7)
Neutrophils Relative %: 54.8 % (ref 43.0–77.0)
Platelets: 219 10*3/uL (ref 150.0–400.0)
RBC: 4.3 Mil/uL (ref 3.87–5.11)
RDW: 13 % (ref 11.5–15.5)
WBC: 5.7 10*3/uL (ref 4.0–10.5)

## 2019-11-10 LAB — LIPID PANEL
Cholesterol: 165 mg/dL (ref 0–200)
HDL: 50.1 mg/dL (ref >39.00)
LDL Cholesterol: 98 mg/dL (ref 0–99)
NonHDL: 114.65
Total CHOL/HDL Ratio: 3
Triglycerides: 84 mg/dL (ref 0.0–149.0)
VLDL: 16.8 mg/dL (ref 0.0–40.0)

## 2019-11-10 LAB — COMPREHENSIVE METABOLIC PANEL
ALT: 16 U/L (ref 0–35)
AST: 18 U/L (ref 0–37)
Albumin: 4.5 g/dL (ref 3.5–5.2)
Alkaline Phosphatase: 89 U/L (ref 39–117)
BUN: 17 mg/dL (ref 6–23)
CO2: 27 mEq/L (ref 19–32)
Calcium: 9.5 mg/dL (ref 8.4–10.5)
Chloride: 106 mEq/L (ref 96–112)
Creatinine, Ser: 0.7 mg/dL (ref 0.40–1.20)
GFR: 86.05 mL/min (ref >60.00)
Glucose, Bld: 85 mg/dL (ref 70–99)
Potassium: 4.9 mEq/L (ref 3.5–5.1)
Sodium: 140 mEq/L (ref 135–145)
Total Bilirubin: 2.1 mg/dL — ABNORMAL HIGH (ref 0.2–1.2)
Total Protein: 6.5 g/dL (ref 6.0–8.3)

## 2019-11-10 LAB — T4, FREE: Free T4: 0.9 ng/dL (ref 0.60–1.60)

## 2019-11-10 LAB — T3, FREE: T3, Free: 3.3 pg/mL (ref 2.3–4.2)

## 2019-11-10 LAB — TSH: TSH: 2.44 u[IU]/mL (ref 0.35–4.50)

## 2019-11-10 NOTE — Progress Notes (Signed)
Subjective:    Patient ID: Rachel Maynard, female    DOB: 09-19-1960, 59 y.o.   MRN: [id number]  DOS:  11/10/2019 Type of visit - description: CPX No major concerns. Reports hot flashes at night, they are not very frequent. Also has easy bruising but denies unusual nosebleeds, gum bleeds or any other bleeding   Review of Systems  Other than above, a 14 point review of systems is negative     Past Medical History:  Diagnosis Date  . BCC (basal cell carcinoma of skin)     s/p [facility] 2009, sees derm  . Colon polyps   . Dyspnea    DOE- CP, 3-10 normal stress  ECHO, 4-10 CT neg for PE, saw pulmonary  . Genital herpes    On Valtrex  . Hypothyroidism     Past Surgical History:  Procedure Laterality Date  . APPENDECTOMY  1980  . CESAREAN SECTION  2012  . CHOLECYSTECTOMY  2004  . SHOULDER ARTHROSCOPY Left 05/2018  . Uterine ablation     Family History  Problem Relation Age of Onset  . Hypertension Father   . Heart failure Father   . Heart disease Father 23       MI  . Colon polyps Father   . Transient ischemic attack Father   . Alzheimer's disease Mother   . Osteoporosis Sister   . Diabetes Other        mother side   . Colon cancer Other        father side  . Lymphoma Other        uncle, aunt F side   . Breast cancer Neg Hx     Allergies as of 11/10/2019      Reactions   Guaifenesin Er    Other reaction(s): Hallucinations   Midazolam Nausea And Vomiting      Medication List       Accurate as of Nov 10, 2019 11:59 PM. If you have any questions, ask your nurse or doctor.        EpiPen 2-Pak 0.3 mg/0.3 mL Soaj injection Generic drug: EPINEPHrine Inject 0.3 mLs (0.3 mg total) into the muscle once as needed.   levothyroxine 100 MCG tablet Commonly known as: SYNTHROID Take 1 tablet (100 mcg total) by mouth daily before breakfast.   valACYclovir 1000 MG tablet Commonly known as: Valtrex Take 0.5 tablets (500 mg total) by mouth 3 (three) times daily. For 3  days per outbreak          Objective:   Physical Exam BP (!) 117/57 (BP Location: Left Arm, Patient Position: Sitting, Cuff Size: Small)   Pulse (!) 59   Temp (!) 96.3 F (35.7 C) (Temporal)   Resp 18   Ht 5\' 4"  (1.626 m)   Wt 159 lb 4 oz (72.2 kg)   SpO2 100%   BMI 27.34 kg/m  General: Well developed, NAD, BMI noted Neck: No  thyromegaly  HEENT:  Normocephalic . Face symmetric, atraumatic Lungs:  CTA B Normal respiratory effort, no intercostal retractions, no accessory muscle use. Heart: RRR,  no murmur.  Abdomen:  Not distended, soft, non-tender. No rebound or rigidity.   Lower extremities: no pretibial edema bilaterally  Skin: Exposed areas without rash. Not pale. Not jaundice Neurologic:  alert & oriented X3.  Speech normal, gait appropriate for age and unassisted Strength symmetric and appropriate for age.  Psych: Cognition and judgment appear intact.  Cooperative with normal attention span and concentration.  Behavior appropriate. No anxious or depressed appearing.     Assessment      Assessment Hypothyroidism Genital herpes, Valtrex Perimenopausal  Peanuts allergy-- epipen BCC, Moh's  surgery 2009,  Sees dermatology DOE, chest pain: CT Chest (-)  PE, normal stress echo, saw pulmonary covid ~ 03/2019   PLAN Here for CPX. Doing well. RTC 6 months   This visit occurred during the SARS-CoV-2 public health emergency.  Safety protocols were in place, including screening questions prior to the visit, additional usage of staff PPE, and extensive cleaning of exam room while observing appropriate contact time as indicated for disinfecting solutions.

## 2019-11-10 NOTE — Patient Instructions (Addendum)
COVID-19 Vaccine Information can be found at: ShippingScam.co.uk For questions related to vaccine distribution or appointments, please email vaccine@[email or web address] or call [phone] [phone].   Take calcium and vitamin D supplements daily  GO TO THE LAB : Get the blood work     Rachel Maynard, [place] back for a checkup in 6 months

## 2019-11-10 NOTE — Progress Notes (Signed)
Pre visit review using our clinic review tool, if applicable. No additional management support is needed unless otherwise documented below in the visit note. 

## 2019-11-11 NOTE — Assessment & Plan Note (Signed)
Here for CPX. Doing well. RTC 6 months

## 2019-11-11 NOTE — Assessment & Plan Note (Signed)
-  Td 2015 - Covid shot: reluctant to proceed, pro/cons discussed --CCS: Cscope x 2 ,  06-2005 (in [facility]) they found polyps Cscope 2008 normal.   cscope 02-2012, (-) cscope 04/18/2017, Dr Norville Haggard , no polyps, + diverticuliwas told ok, no records, will try to get them --female care:  saw gyn 2020 per pt ([place])  [facility] 11/2017 and 2020 (no records)  PAP  DEXA @ gyn, ?osteopenia, recommend calcium and vitamin D --Labs: CMP, FLP, CBC, TSH, T3, T4 --Diet-exercise discussed

## 2019-11-23 ENCOUNTER — Other Ambulatory Visit: Payer: Self-pay

## 2019-11-23 ENCOUNTER — Ambulatory Visit: Payer: 59 | Admitting: Family

## 2019-11-23 ENCOUNTER — Encounter: Payer: Self-pay | Admitting: Family

## 2019-11-23 VITALS — BP 129/65 | HR 64 | Temp 98.2°F | Resp 16 | Ht 64.0 in | Wt 165.0 lb

## 2019-11-23 DIAGNOSIS — R0789 Other chest pain: Secondary | ICD-10-CM

## 2019-11-23 NOTE — Patient Instructions (Signed)
It is our recommendation that you go to the ER now for further evaluation of your chest pain so that we can make sure you are not having a heart attack.

## 2019-11-23 NOTE — Progress Notes (Signed)
Subjective:    Patient ID: Rachel Maynard, female    DOB: [birth year], 59 y.o.   MRN: [id number]  HPI  Patient is a 59 year old female who presents today with chief complaint of chest pressure.  Her past medical history is significant for hypothyroidism, genital herpes, and basal cell carcinoma.  She has a remote history of dyspnea on exertion which was worked up back in 2010.  Work-up included a normal stress test, normal echo, and CT which was negative for PE.  Yesterday left arm numbness. Mild. Today developed left arm numbness and pain.  She reports that numbness last half hour started 8:30 AM. Then developed chest pressure (left side).  Chest pressure is still present. She denies SOB.      Review of Systems    see HPI  Past Medical History:  Diagnosis Date  . BCC (basal cell carcinoma of skin)     s/p [facility] 2009, sees derm  . Colon polyps   . Dyspnea    DOE- CP, 3-10 normal stress  ECHO, 4-10 CT neg for PE, saw pulmonary  . Genital herpes    On Valtrex  . Hypothyroidism      Social History   Socioeconomic History  . Marital status: Single    Spouse name: Not on file  . Number of children: 1  . Years of education: Not on file  . Highest education level: Not on file  Occupational History  . Occupation: Publishing rights manager, sedentary    Employer: [employer]  Tobacco Use  . Smoking status: Never Smoker  . Smokeless tobacco: Never Used  Substance and Sexual Activity  . Alcohol use: No    Alcohol/week: 0.0 standard drinks    Comment: Rare  . Drug use: No  . Sexual activity: Not Currently    Birth control/protection: None  Other Topics Concern  . Not on file  Social History Narrative   Single, 1 child, boy 12 y/o  dx w/ Asperger's  highly funtional          Social Determinants of Radio broadcast assistant Strain:   . Difficulty of Paying Living Expenses:   Food Insecurity:   . Worried About Charity fundraiser in the Last Year:   . Arboriculturist in the  Last Year:   Transportation Needs:   . Film/video editor (Medical):   Marland Kitchen Lack of Transportation (Non-Medical):   Physical Activity:   . Days of Exercise per Week:   . Minutes of Exercise per Session:   Stress:   . Feeling of Stress :   Social Connections:   . Frequency of Communication with Friends and Family:   . Frequency of Social Gatherings with Friends and Family:   . Attends Religious Services:   . Active Member of Clubs or Organizations:   . Attends Archivist Meetings:   Marland Kitchen Marital Status:   Intimate Partner Violence:   . Fear of Current or Ex-Partner:   . Emotionally Abused:   Marland Kitchen Physically Abused:   . Sexually Abused:     Past Surgical History:  Procedure Laterality Date  . APPENDECTOMY  1980  . CESAREAN SECTION  2012  . CHOLECYSTECTOMY  2004  . SHOULDER ARTHROSCOPY Left 05/2018  . Uterine ablation      Family History  Problem Relation Age of Onset  . Hypertension Father   . Heart failure Father   . Heart disease Father 42  MI  . Colon polyps Father   . Transient ischemic attack Father   . Alzheimer's disease Mother   . Osteoporosis Sister   . Diabetes Other        mother side   . Colon cancer Other        father side  . Lymphoma Other        uncle, aunt F side   . Breast cancer Neg Hx     Allergies  Allergen Reactions  . Guaifenesin Er     Other reaction(s): Hallucinations  . Midazolam Nausea And Vomiting    Current Outpatient Medications on File Prior to Visit  Medication Sig Dispense Refill  . EPINEPHrine (EPIPEN 2-PAK) 0.3 mg/0.3 mL IJ SOAJ injection Inject 0.3 mLs (0.3 mg total) into the muscle once as needed.    Marland Kitchen levothyroxine (SYNTHROID) 100 MCG tablet Take 1 tablet (100 mcg total) by mouth daily before breakfast. 90 tablet 1  . valACYclovir (VALTREX) 1000 MG tablet Take 0.5 tablets (500 mg total) by mouth 3 (three) times daily. For 3 days per outbreak 90 tablet 1   No current facility-administered medications on file  prior to visit.    BP 129/65 (BP Location: Left Arm, Patient Position: Sitting, Cuff Size: Small)   Pulse 64   Temp 98.2 F (36.8 C) (Temporal)   Resp 16   Ht 5\' 4"  (1.626 m)   Wt 165 lb (74.8 kg)   SpO2 100%   BMI 28.32 kg/m    Objective:   Physical Exam Constitutional:      Appearance: She is well-developed.  Cardiovascular:     Rate and Rhythm: Normal rate and regular rhythm.     Heart sounds: Normal heart sounds. No murmur.  Pulmonary:     Effort: Pulmonary effort is normal. No respiratory distress.     Breath sounds: Normal breath sounds. No wheezing.  Psychiatric:        Behavior: Behavior normal.        Thought Content: Thought content normal.        Judgment: Judgment normal.           Assessment & Plan:  Left sided chest pain- new.  EKG tracing is personally reviewed.  EKG notes NSR.  No acute changes.    Given the fact that she is still having left sided chest pain, I have advised her to go now to the ER to rule out MI.  She is requesting outpatient work up which I advised her we are unable to do.  She is asking if cardiology could do outpatient and I advised her that if she is having active chest pain that they too would recommend ED. She reports that she has too much to do to go to the ER and cannot afford to go.  She states that she understands the risks of not being evaluated and states that " I will watch it."  30 minutes spent on today's visit.  The majority of this time was spent on chart review and patient counseling.  This visit occurred during the SARS-CoV-2 public health emergency.  Safety protocols were in place, including screening questions prior to the visit, additional usage of staff PPE, and extensive cleaning of exam room while observing appropriate contact time as indicated for disinfecting solutions.

## 2019-11-24 ENCOUNTER — Telehealth: Payer: Self-pay | Admitting: Family

## 2019-11-24 DIAGNOSIS — R079 Chest pain, unspecified: Secondary | ICD-10-CM

## 2019-11-24 NOTE — Telephone Encounter (Signed)
Attempted to reach pt to see how she was feeling. No answer.

## 2019-11-29 NOTE — Telephone Encounter (Signed)
Spoke to patient. She reports resolution of her chest pain. States she is will to see cardiology for consult.  Reminded pt that she should go to the ER if she develops recurrent chest pain.

## 2019-12-08 ENCOUNTER — Encounter: Payer: Self-pay | Admitting: General Practice

## 2019-12-30 ENCOUNTER — Encounter: Payer: Self-pay | Admitting: Internal Medicine

## 2020-01-20 NOTE — Progress Notes (Signed)
Rachel Nose, NP Reason for referral-chest pain  HPI: 59 year old female for evaluation of chest pain at request of Debbrah Alar, NP.  Stress echocardiogram March 2010 normal.  Laboratories May 2021 revealed normal TSH, hemoglobin 13.9, BUN 17 and creatinine 0.70. LDL 98.  Over the past 6 months the patient has had intermittent chest pain.  These pains are short-lived lasting several minutes.  There can be a sharp pain under her left breast and a pressure sensation in her left upper chest.  These are not related to activities and not pleuritic.  No radiation or associated symptoms.  Note she does occasionally have these more after she eats.  She otherwise denies exertional chest pain, dyspnea on exertion, orthopnea, PND or pedal edema.  No syncope.  Cardiology now asked to evaluate.  Current Outpatient Medications  Medication Sig Dispense Refill   levothyroxine (SYNTHROID) 100 MCG tablet Take 1 tablet (100 mcg total) by mouth daily before breakfast. 90 tablet 1   valACYclovir (VALTREX) 1000 MG tablet Take 0.5 tablets (500 mg total) by mouth 3 (three) times daily. For 3 days per outbreak 90 tablet 1   No current facility-administered medications for this visit.    Allergies  Allergen Reactions   Guaifenesin Er     Other reaction(s): Hallucinations   Midazolam Nausea And Vomiting     Past Medical History:  Diagnosis Date   BCC (basal cell carcinoma of skin)     s/p MOHs 2009, sees derm   Colon polyps    Dyspnea    DOE- CP, 3-10 normal stress  ECHO, 4-10 CT neg for PE, saw pulmonary   Genital herpes    On Valtrex   Hypothyroidism     Past Surgical History:  Procedure Laterality Date   APPENDECTOMY  1980   CESAREAN SECTION  2012   CHOLECYSTECTOMY  2004   SHOULDER ARTHROSCOPY Left 05/2018   Uterine ablation      Social History   Socioeconomic History   Marital status: Divorced    Spouse name: Not on file   Number of children: 1    Years of education: Not on file   Highest education level: Not on file  Occupational History   Occupation: Publishing rights manager, sedentary    Employer: [employer]  Tobacco Use   Smoking status: Never Smoker   Smokeless tobacco: Never Used  Substance and Sexual Activity   Alcohol use: Yes    Alcohol/week: 0.0 standard drinks    Comment: Rare   Drug use: No   Sexual activity: Not Currently    Birth control/protection: None  Other Topics Concern   Not on file  Social History Narrative   Single, 1 child, boy 48 y/o  dx w/ Asperger's  highly funtional          Social Determinants of Radio broadcast assistant Strain:    Difficulty of Paying Living Expenses:   Food Insecurity:    Worried About Charity fundraiser in the Last Year:    Arboriculturist in the Last Year:   Transportation Needs:    Film/video editor (Medical):    Lack of Transportation (Non-Medical):   Physical Activity:    Days of Exercise per Week:    Minutes of Exercise per Session:   Stress:    Feeling of Stress :   Social Connections:    Frequency of Communication with Friends and Family:    Frequency of Social Gatherings with Friends and Family:  Attends Religious Services:    Active Member of Clubs or Organizations:    Attends Music therapist:    Marital Status:   Intimate Partner Violence:    Fear of Current or Ex-Partner:    Emotionally Abused:    Physically Abused:    Sexually Abused:     Family History  Problem Relation Age of Onset   Hypertension Father    Heart failure Father    Heart disease Father 8       MI   Colon polyps Father    Transient ischemic attack Father    Alzheimer's disease Mother    Osteoporosis Sister    Diabetes Other        mother side    Colon cancer Other        father side   Lymphoma Other        uncle, aunt F side    Breast cancer Neg Hx     ROS: no fevers or chills, productive cough, hemoptysis,  dysphasia, odynophagia, melena, hematochezia, dysuria, hematuria, rash, seizure activity, orthopnea, PND, pedal edema, claudication. Remaining systems are negative.  Physical Exam:   Blood pressure 124/84, pulse (!) 58, height 5\' 4"  (1.626 m), weight 165 lb (74.8 kg).  General:  Well developed/well nourished in NAD Skin warm/dry Patient not depressed No peripheral clubbing Back-normal HEENT-normal/normal eyelids Neck supple/normal carotid upstroke bilaterally; no bruits; no JVD; no thyromegaly chest - CTA/ normal expansion CV - RRR/normal S1 and S2; no murmurs, rubs or gallops;  PMI nondisplaced Abdomen -NT/ND, no HSM, no mass, + bowel sounds, no bruit 2+ femoral pulses, no bruits Ext-no edema, chords, 2+ DP Neuro-grossly nonfocal  ECG -November 23, 2019-sinus rhythm with no ST changes.  Personally reviewed  Today's electrocardiogram shows sinus bradycardia with no ST changes.  Personally reviewed.  A/P  1 chest pain-symptoms are atypical.  Electrocardiogram shows no ST changes.  We discussed further evaluation including cardiac CTA.  She will consider and contact us if she would like to proceed in the future.  2 hypothyroidism-Per primary care.  Kirk Ruths, MD

## 2020-01-26 ENCOUNTER — Ambulatory Visit: Payer: 59 | Admitting: Cardiology

## 2020-01-26 ENCOUNTER — Encounter: Payer: Self-pay | Admitting: Cardiology

## 2020-01-26 ENCOUNTER — Other Ambulatory Visit: Payer: Self-pay

## 2020-01-26 VITALS — BP 124/84 | HR 58 | Ht 64.0 in | Wt 165.0 lb

## 2020-01-26 DIAGNOSIS — R072 Precordial pain: Secondary | ICD-10-CM | POA: Diagnosis not present

## 2020-01-26 NOTE — Patient Instructions (Signed)
Medication Instructions:  NO CHANGE *If you need a refill on your cardiac medications before your next appointment, please call your pharmacy*   Lab Work: If you have labs (blood work) drawn today and your tests are completely normal, you will receive your results only by: . MyChart Message (if you have MyChart) OR . A paper copy in the mail If you have any lab test that is abnormal or we need to change your treatment, we will call you to review the results.   Follow-Up: At [facility], you and your health needs are our priority.  As part of our continuing mission to provide you with exceptional heart care, we have created designated Provider Care Teams.  These Care Teams include your primary Cardiologist (physician) and Advanced Practice Providers (APPs -  Physician Assistants and Nurse Practitioners) who all work together to provide you with the care you need, when you need it.  We recommend signing up for the patient portal called "MyChart".  Sign up information is provided on this After Visit Summary.  MyChart is used to connect with patients for Virtual Visits (Telemedicine).  Patients are able to view lab/test results, encounter notes, upcoming appointments, etc.  Non-urgent messages can be sent to your provider as well.   To learn more about what you can do with MyChart, go to [email or web address].    Your next appointment:   6 month(s)  The format for your next appointment:   In Person  Provider:   Brian Crenshaw, MD    

## 2020-02-15 ENCOUNTER — Encounter: Payer: Self-pay | Admitting: Internal Medicine

## 2020-02-15 LAB — HM MAMMOGRAPHY

## 2020-04-25 ENCOUNTER — Other Ambulatory Visit: Payer: Self-pay

## 2020-04-25 MED ORDER — LEVOTHYROXINE SODIUM 100 MCG PO TABS: 100.0000 ug | ORAL_TABLET | Freq: Every day | ORAL | 0 refills | Status: DC

## 2020-05-27 ENCOUNTER — Encounter: Payer: Self-pay | Admitting: Internal Medicine

## 2020-05-29 MED ORDER — VALACYCLOVIR HCL 1 G PO TABS: 500.0000 mg | ORAL_TABLET | Freq: Three times a day (TID) | ORAL | 1 refills | Status: DC

## 2020-06-01 ENCOUNTER — Telehealth: Payer: Self-pay | Admitting: Internal Medicine

## 2020-06-01 NOTE — Telephone Encounter (Signed)
Per Dr. Ethel Rana notes from 10/2019- he wanted to see her back in the clinic in 6 months. Please schedule at her convenience. Labs will be completed at that visit.

## 2020-06-01 NOTE — Telephone Encounter (Signed)
According to a message on last rx Levothyroxine patient needs to schedule an appointment before further refills. Patient states she only needs labs. Please advise

## 2020-06-02 NOTE — Telephone Encounter (Signed)
Left voicemail to scheduled appt 

## 2020-06-15 ENCOUNTER — Ambulatory Visit: Payer: 59 | Admitting: Internal Medicine

## 2020-06-20 ENCOUNTER — Other Ambulatory Visit: Payer: Self-pay

## 2020-06-20 ENCOUNTER — Encounter: Payer: Self-pay | Admitting: Internal Medicine

## 2020-06-20 ENCOUNTER — Ambulatory Visit: Payer: 59 | Admitting: Internal Medicine

## 2020-06-20 VITALS — BP 119/76 | HR 67 | Temp 97.9°F | Ht 64.0 in | Wt 170.6 lb

## 2020-06-20 DIAGNOSIS — E039 Hypothyroidism, unspecified: Secondary | ICD-10-CM

## 2020-06-20 MED ORDER — LEVOTHYROXINE SODIUM 100 MCG PO TABS: 100.0000 ug | ORAL_TABLET | Freq: Every day | ORAL | 2 refills | Status: DC

## 2020-06-20 NOTE — Progress Notes (Signed)
Subjective:    Patient ID: Rachel Maynard, female    DOB: 1960-06-25, 59 y.o.   MRN: [id number]  DOS:  06/20/2020 Type of visit - description: Routine checkup  Good compliance with Synthroid 100 mcg until she ran out, for the last 2 weeks is taking 80 mcg daily. Atypical chest pain: Saw cardiology. They talk about further evaluation with a cardiac CTA, patient declined. Symptoms are actually atypical, less frequent and intense now, not exertional She thinks emotional stress is playing a role in her symptoms.     Review of Systems See above   Past Medical History:  Diagnosis Date  . BCC (basal cell carcinoma of skin)     s/p [facility] 2009, sees derm  . Colon polyps   . Dyspnea    DOE- CP, 3-10 normal stress  ECHO, 4-10 CT neg for PE, saw pulmonary  . Genital herpes    On Valtrex  . Hypothyroidism     Past Surgical History:  Procedure Laterality Date  . APPENDECTOMY  1980  . CESAREAN SECTION  2012  . CHOLECYSTECTOMY  2004  . SHOULDER ARTHROSCOPY Left 05/2018  . Uterine ablation      Allergies as of 06/20/2020      Reactions   Guaifenesin Er    Other reaction(s): Hallucinations   Midazolam Nausea And Vomiting      Medication List       Accurate as of June 20, 2020  8:00 PM. If you have any questions, ask your nurse or doctor.        levothyroxine 100 MCG tablet Commonly known as: SYNTHROID Take 1 tablet (100 mcg total) by mouth daily before breakfast.   valACYclovir 1000 MG tablet Commonly known as: Valtrex Take 0.5 tablets (500 mg total) by mouth 3 (three) times daily. For 3 days per outbreak          Objective:   Physical Exam BP 119/76 (BP Location: Left Arm, Patient Position: Sitting, Cuff Size: Large)   Pulse 67   Temp 97.9 F (36.6 C) (Oral)   Ht 5\' 4"  (1.626 m)   Wt 170 lb 9.6 oz (77.4 kg)   SpO2 99%   BMI 29.28 kg/m  General:   Well developed, NAD, BMI noted. HEENT:  Normocephalic . Face symmetric, atraumatic Lungs:  CTA B Normal  respiratory effort, no intercostal retractions, no accessory muscle use. Heart: RRR,  no murmur.  Lower extremities: no pretibial edema bilaterally  Skin: Not pale. Not jaundice Neurologic:  alert & oriented X3.  Speech normal, gait appropriate for age and unassisted Psych--  Cognition and judgment appear intact.  Cooperative with normal attention span and concentration.  Behavior appropriate. No anxious or depressed appearing.      Assessment     Assessment Hypothyroidism Genital herpes, Valtrex Perimenopausal  Peanuts allergy-- epipen BCC, Moh's  surgery 2009,  Sees dermatology q year DOE, chest pain: CT Chest (-)  PE, normal stress echo, saw pulmonary covid ~ 03/2019   PLAN Hypothyroidism: Ran out of Synthroid 100 mcg recently, taking a leftover 88 mcg daily, requested a 90-day supply, will do. Check a TSH in 2 months at a different lab (she now lives in [place]). Atypical chest pain, saw cardiology, declined  further work-up.  Symptoms are decreasing, patient thinks they are stress related. Preventive care:  Had for Covid vaccine 06/16/2020, recommend follow-up vaccine booster. Recommend flu shot RTC 10/2020 CPX     this visit occurred during the SARS-CoV-2 public health emergency.  Safety protocols were in place, including screening questions prior to the visit, additional usage of staff PPE, and extensive cleaning of exam room while observing appropriate contact time as indicated for disinfecting solutions.

## 2020-06-20 NOTE — Patient Instructions (Addendum)
Take Synthroid 100 mcg every day  In 2 months get a TSH check @  [facility] in your community and let me know the results  GO TO THE FRONT DESK, PLEASE SCHEDULE YOUR APPOINTMENTS Come back for   A physical by 10/2020

## 2020-06-20 NOTE — Assessment & Plan Note (Signed)
Hypothyroidism: Ran out of Synthroid 100 mcg recently, taking a leftover 88 mcg daily, requested a 90-day supply, will do. Check a TSH in 2 months at a different lab (she now lives in [place]). Atypical chest pain, saw cardiology, declined  further work-up.  Symptoms are decreasing, patient thinks they are stress related. Preventive care:  Had for Covid vaccine 06/16/2020, recommend follow-up vaccine booster. Recommend flu shot RTC 10/2020 CPX

## 2020-07-23 ENCOUNTER — Encounter: Payer: Self-pay | Admitting: Internal Medicine

## 2020-08-18 LAB — TSH: TSH: 3.14 u[IU]/mL (ref 0.450–4.500)

## 2020-08-29 ENCOUNTER — Other Ambulatory Visit: Payer: Self-pay

## 2020-08-29 MED ORDER — LEVOTHYROXINE SODIUM 100 MCG PO TABS: 100.0000 ug | ORAL_TABLET | Freq: Every day | ORAL | 2 refills | Status: DC

## 2020-09-04 ENCOUNTER — Other Ambulatory Visit: Payer: Self-pay

## 2020-09-04 MED ORDER — VALACYCLOVIR HCL 1 G PO TABS: 500.0000 mg | ORAL_TABLET | Freq: Three times a day (TID) | ORAL | 1 refills | Status: DC

## 2020-09-15 ENCOUNTER — Encounter: Payer: Self-pay | Admitting: Internal Medicine

## 2021-01-24 ENCOUNTER — Encounter: Payer: Self-pay | Admitting: Internal Medicine

## 2021-01-24 DIAGNOSIS — R17 Unspecified jaundice: Secondary | ICD-10-CM

## 2021-02-19 ENCOUNTER — Encounter: Payer: Self-pay | Admitting: Internal Medicine

## 2021-02-28 ENCOUNTER — Encounter: Payer: Self-pay | Admitting: Internal Medicine

## 2021-02-28 LAB — HM MAMMOGRAPHY

## 2021-04-11 ENCOUNTER — Encounter: Payer: Self-pay | Admitting: Internal Medicine

## 2021-04-23 ENCOUNTER — Other Ambulatory Visit: Payer: Self-pay

## 2021-04-23 ENCOUNTER — Encounter: Payer: Self-pay | Admitting: Internal Medicine

## 2021-04-23 ENCOUNTER — Ambulatory Visit (INDEPENDENT_AMBULATORY_CARE_PROVIDER_SITE_OTHER): Payer: 59 | Admitting: Internal Medicine

## 2021-04-23 VITALS — BP 116/74 | HR 61 | Temp 97.9°F | Resp 16 | Ht 64.0 in | Wt 167.4 lb

## 2021-04-23 DIAGNOSIS — Z0000 Encounter for general adult medical examination without abnormal findings: Secondary | ICD-10-CM

## 2021-04-23 DIAGNOSIS — E039 Hypothyroidism, unspecified: Secondary | ICD-10-CM | POA: Diagnosis not present

## 2021-04-23 DIAGNOSIS — E559 Vitamin D deficiency, unspecified: Secondary | ICD-10-CM

## 2021-04-23 NOTE — Progress Notes (Signed)
Subjective:    Patient ID: Rachel Maynard, female    DOB: Nov 11, 1960, 60 y.o.   MRN: [id number]  DOS:  04/23/2021 Type of visit - description: CPX  In general feels well. She states that is unable to lose her abdominal adipose tissue despite a very healthy lifestyle  Review of Systems  Other than above, a 14 point review of systems is negative     Past Medical History:  Diagnosis Date   BCC (basal cell carcinoma of skin)     s/p MOHs 2009, sees derm   Colon polyps    Dyspnea    DOE- CP, 3-10 normal stress  ECHO, 4-10 CT neg for PE, saw pulmonary   Genital herpes    On Valtrex   Hypothyroidism     Past Surgical History:  Procedure Laterality Date   APPENDECTOMY  1980   CESAREAN SECTION  2012   CHOLECYSTECTOMY  2004   SHOULDER ARTHROSCOPY Left 05/2018   Uterine ablation     Social History   Socioeconomic History   Marital status: Divorced    Spouse name: Not on file   Number of children: 1   Years of education: Not on file   Highest education level: Not on file  Occupational History   Occupation: Publishing rights manager, sedentary    Employer: [employer]  Tobacco Use   Smoking status: Never   Smokeless tobacco: Never  Substance and Sexual Activity   Alcohol use: Yes    Alcohol/week: 0.0 standard drinks    Comment: Rare   Drug use: No   Sexual activity: Not Currently    Birth control/protection: None  Other Topics Concern   Not on file  Social History Narrative   Single, 1 child, boy 60 y/o  dx w/ Asperger's  highly funtional          Social Determinants of Health   Financial Resource Strain: Not on file  Food Insecurity: Not on file  Transportation Needs: Not on file  Physical Activity: Not on file  Stress: Not on file  Social Connections: Not on file  Intimate Partner Violence: Not on file    Allergies as of 04/23/2021       Reactions   Guaifenesin Er    Other reaction(s): Hallucinations   Midazolam Nausea And Vomiting        Medication  List        Accurate as of April 23, 2021  6:58 PM. If you have any questions, ask your nurse or doctor.          levothyroxine 100 MCG tablet Commonly known as: SYNTHROID Take 1 tablet (100 mcg total) by mouth daily before breakfast.   valACYclovir 1000 MG tablet Commonly known as: Valtrex Take 0.5 tablets (500 mg total) by mouth 3 (three) times daily. For 3 days per outbreak           Objective:   Physical Exam BP 116/74 (BP Location: Left Arm, Patient Position: Sitting, Cuff Size: Small)   Pulse 61   Temp 97.9 F (36.6 C) (Oral)   Resp 16   Ht 5\' 4"  (1.626 m)   Wt 167 lb 6 oz (75.9 kg)   SpO2 97%   BMI 28.73 kg/m  General: Well developed, NAD, BMI noted Neck: No  thyromegaly  HEENT:  Normocephalic . Face symmetric, atraumatic Lungs:  CTA B Normal respiratory effort, no intercostal retractions, no accessory muscle use. Heart: RRR,  no murmur.  Abdomen:  Not distended, soft,  non-tender. No rebound or rigidity.   Lower extremities: no pretibial edema bilaterally  Skin: Exposed areas without rash. Not pale. Not jaundice Neurologic:  alert & oriented X3.  Speech normal, gait appropriate for age and unassisted Strength symmetric and appropriate for age.  Psych: Cognition and judgment appear intact.  Cooperative with normal attention span and concentration.  Behavior appropriate. No anxious or depressed appearing.     Assessment     Assessment Hypothyroidism Genital herpes, Valtrex Perimenopausal  Peanuts allergy-- epipen BCC, Moh's  surgery 2009,  Sees dermatology q year DOE, chest pain: CT Chest (-)  PE, normal stress echo, saw pulmonary Vit d def  Hepatic asteatosis. Per [place] 02-2021 covid ~ 03/2019   PLAN Here for CPX Hypothyroidism: Saw Endo last year, TFTs okay, TPO slightly elevated, does not plan to see them again.  Check TSH Increased bilirubin: Saw GI 02/19/2021 due to elevated bilirubin.    Repeated LFTs show a total bili of 1.8.  Other  LFTs normal. Liver ultrasound: Hepatic asteatosis.  Was recommended no further eval and  work on diet and exercise. RTC 6 months-     This visit occurred during the SARS-CoV-2 public health emergency.  Safety protocols were in place, including screening questions prior to the visit, additional usage of staff PPE, and extensive cleaning of exam room while observing appropriate contact time as indicated for disinfecting solutions.

## 2021-04-23 NOTE — Assessment & Plan Note (Signed)
Here for CPX Hypothyroidism: Saw Endo last year, TFTs okay, TPO slightly elevated, does not plan to see them again.  Check TSH Increased bilirubin: Saw GI 02/19/2021 due to elevated bilirubin.    Repeated LFTs show a total bili of 1.8.  Other LFTs normal. Liver ultrasound: Hepatic asteatosis.  Was recommended no further eval and  work on diet and exercise. RTC 6 months-

## 2021-04-23 NOTE — Assessment & Plan Note (Signed)
-  Td 2015 - Shingrix discussed - Covid shot: had the bivalent covid vax 04-05-21 - had a flu shot  --CCS: Cscope x 2 ,  06-2005 (in [facility]) they found polyps Cscope 2008 normal.   cscope 02-2012, (-) cscope 04/18/2017, Dr Norville Haggard , no polyps, + diverticuli; was told next 2023  --female care:  sees gyn ([place])  MMG : 02-28-2021  DEXA @ gyn, ?osteopenia, recommend calcium and vitamin D --Labs: CMP, FLP,CBC, TSH, vit D.  Will RTC fasting --Diet-exercise: Doing great, eats healthy, exercises regularly. - H-POA d/w pt

## 2021-04-23 NOTE — Patient Instructions (Addendum)
    GO TO THE FRONT DESK, PLEASE SCHEDULE YOUR APPOINTMENTS Come back for   labs, fasting within few days   Come back for  a routine check up in 6 months   "Living will", "[place] of attorney": Advanced care planning  (If you already have a living will or healthcare power of attorney, please bring the copy to be scanned in your chart.)  Advance care planning is a process that supports adults in  understanding and sharing their preferences regarding future medical care.   The patient's preferences are recorded in documents called Advance Directives.    Advanced directives are completed (and can be modified at any time) while the patient is in full mental capacity.   The documentation should be available at all times to the patient, the family and the healthcare providers.  Bring in a copy to be scanned in your chart is an excellent idea and is recommended   This legal documents direct treatment decision making and/or appoint a surrogate to make the decision if the patient is not capable to do so.    Advance directives can be documented in many types of formats,  documents have names such as:  Lliving will  Durable power of attorney for healthcare (healthcare proxy or healthcare power of attorney)  Combined directives  Physician orders for life-sustaining treatment    More information at:  [email or web address]

## 2021-04-30 ENCOUNTER — Other Ambulatory Visit: Payer: Self-pay

## 2021-04-30 ENCOUNTER — Other Ambulatory Visit (INDEPENDENT_AMBULATORY_CARE_PROVIDER_SITE_OTHER): Payer: 59

## 2021-04-30 DIAGNOSIS — E559 Vitamin D deficiency, unspecified: Secondary | ICD-10-CM | POA: Diagnosis not present

## 2021-04-30 DIAGNOSIS — E039 Hypothyroidism, unspecified: Secondary | ICD-10-CM | POA: Diagnosis not present

## 2021-04-30 DIAGNOSIS — Z0000 Encounter for general adult medical examination without abnormal findings: Secondary | ICD-10-CM

## 2021-04-30 LAB — CBC WITH DIFFERENTIAL/PLATELET
Basophils Absolute: 0.1 10*3/uL (ref 0.0–0.1)
Basophils Relative: 1.3 % (ref 0.0–3.0)
Eosinophils Absolute: 0.2 10*3/uL (ref 0.0–0.7)
Eosinophils Relative: 2.8 % (ref 0.0–5.0)
HCT: 40.6 % (ref 36.0–46.0)
Hemoglobin: 14.1 g/dL (ref 12.0–15.0)
Lymphocytes Relative: 35.4 % (ref 12.0–46.0)
Lymphs Abs: 2.2 10*3/uL (ref 0.7–4.0)
MCHC: 34.8 g/dL (ref 30.0–36.0)
MCV: 90.5 fl (ref 78.0–100.0)
Monocytes Absolute: 0.4 10*3/uL (ref 0.1–1.0)
Monocytes Relative: 6.4 % (ref 3.0–12.0)
Neutro Abs: 3.3 10*3/uL (ref 1.4–7.7)
Neutrophils Relative %: 54.1 % (ref 43.0–77.0)
Platelets: 228 10*3/uL (ref 150.0–400.0)
RBC: 4.49 Mil/uL (ref 3.87–5.11)
RDW: 13.1 % (ref 11.5–15.5)
WBC: 6.1 10*3/uL (ref 4.0–10.5)

## 2021-04-30 LAB — COMPREHENSIVE METABOLIC PANEL
ALT: 16 U/L (ref 0–35)
AST: 19 U/L (ref 0–37)
Albumin: 4.5 g/dL (ref 3.5–5.2)
Alkaline Phosphatase: 88 U/L (ref 39–117)
BUN: 13 mg/dL (ref 6–23)
CO2: 28 mEq/L (ref 19–32)
Calcium: 9.5 mg/dL (ref 8.4–10.5)
Chloride: 107 mEq/L (ref 96–112)
Creatinine, Ser: 0.72 mg/dL (ref 0.40–1.20)
GFR: 91.71 mL/min (ref >60.00)
Glucose, Bld: 92 mg/dL (ref 70–99)
Potassium: 4.6 mEq/L (ref 3.5–5.1)
Sodium: 141 mEq/L (ref 135–145)
Total Bilirubin: 1.9 mg/dL — ABNORMAL HIGH (ref 0.2–1.2)
Total Protein: 6.7 g/dL (ref 6.0–8.3)

## 2021-04-30 LAB — VITAMIN D 25 HYDROXY (VIT D DEFICIENCY, FRACTURES): VITD: 20.06 ng/mL — ABNORMAL LOW (ref 30.00–100.00)

## 2021-04-30 LAB — TSH: TSH: 1.18 u[IU]/mL (ref 0.35–5.50)

## 2021-04-30 LAB — LIPID PANEL
Cholesterol: 176 mg/dL (ref 0–200)
HDL: 55.5 mg/dL (ref >39.00)
LDL Cholesterol: 92 mg/dL (ref 0–99)
NonHDL: 120.73
Total CHOL/HDL Ratio: 3
Triglycerides: 143 mg/dL (ref 0.0–149.0)
VLDL: 28.6 mg/dL (ref 0.0–40.0)

## 2021-05-03 MED ORDER — VITAMIN D (ERGOCALCIFEROL) 1.25 MG (50000 UNIT) PO CAPS: 50000.0000 [IU] | ORAL_CAPSULE | ORAL | 0 refills | Status: DC

## 2021-05-03 NOTE — Addendum Note (Signed)
Addended by: Damita Dunnings D on: 05/03/2021 12:51 PM   Modules accepted: Orders

## 2021-07-17 ENCOUNTER — Other Ambulatory Visit: Payer: Self-pay | Admitting: Internal Medicine

## 2021-09-23 ENCOUNTER — Encounter: Payer: Self-pay | Admitting: Internal Medicine

## 2021-10-18 ENCOUNTER — Ambulatory Visit: Payer: 59 | Admitting: Internal Medicine

## 2021-10-18 ENCOUNTER — Encounter: Payer: Self-pay | Admitting: Internal Medicine

## 2021-10-18 VITALS — BP 120/68 | HR 52 | Temp 97.8°F | Resp 16 | Ht 64.0 in | Wt 163.0 lb

## 2021-10-18 DIAGNOSIS — E559 Vitamin D deficiency, unspecified: Secondary | ICD-10-CM | POA: Diagnosis not present

## 2021-10-18 DIAGNOSIS — E039 Hypothyroidism, unspecified: Secondary | ICD-10-CM | POA: Diagnosis not present

## 2021-10-18 LAB — TSH: TSH: 1.38 u[IU]/mL (ref 0.35–5.50)

## 2021-10-18 LAB — VITAMIN D 25 HYDROXY (VIT D DEFICIENCY, FRACTURES): VITD: 27.5 ng/mL — ABNORMAL LOW (ref 30.00–100.00)

## 2021-10-18 NOTE — Assessment & Plan Note (Signed)
Hypothyroidism: Good compliance with levothyroxine, takes it correctly away from vitamins.  Check TSH ?Vitamin D deficiency: Had ergocalciferol, taking OTC vitamin D most days. ?Check levels. ?Preventive care: Could take Shingrix, not inclined to do so at this point. ?RTC 6 months CPX ?

## 2021-10-18 NOTE — Patient Instructions (Signed)
? ? ?  GO TO THE LAB : Get the blood work   ? ? ?[place], [place] ?Come back for  a physical exam by October 2023 ?

## 2021-10-18 NOTE — Progress Notes (Signed)
? ?  Subjective:  ? ? Patient ID: Rachel Maynard, female    DOB: Nov 19, 1960, 61 y.o.   MRN: [id number] ? ?DOS:  10/18/2021 ?Type of visit - description: f/u ? ?Since last office visit  is doing well. ?Good compliance with Synthroid. ?Took ergocalciferol. ?Noted her hair was getting  thin but no major problem ? ?Review of Systems ?See above  ? ?Past Medical History:  ?Diagnosis Date  ? BCC (basal cell carcinoma of skin)   ?  s/p [facility] 2009, sees derm  ? Colon polyps   ? Dyspnea   ? DOE- CP, 3-10 normal stress  ECHO, 4-10 CT neg for PE, saw pulmonary  ? Genital herpes   ? On Valtrex  ? Hypothyroidism   ? ? ?Past Surgical History:  ?Procedure Laterality Date  ? APPENDECTOMY  1980  ? CESAREAN SECTION  2012  ? CHOLECYSTECTOMY  2004  ? SHOULDER ARTHROSCOPY Left 05/2018  ? Uterine ablation    ? ? ?Current Outpatient Medications  ?Medication Instructions  ? levothyroxine (SYNTHROID) 100 mcg, Oral, Daily before breakfast  ? valACYclovir (VALTREX) 500 mg, Oral, 3 times daily, For 3 days per outbreak  ? Vitamin D (Ergocalciferol) (DRISDOL) 50,000 Units, Oral, Every 7 days  ? ? ?   ?Objective:  ? Physical Exam ?BP 120/68 (BP Location: Left Arm, Patient Position: Sitting, Cuff Size: Small)   Pulse (!) 52   Temp 97.8 ?F (36.6 ?C) (Oral)   Resp 16   Ht '5\' 4"'$  (1.626 m)   Wt 163 lb (73.9 kg)   SpO2 96%   BMI 27.98 kg/m?  ?General:   ?Well developed, NAD, BMI noted. ?HEENT:  ?Normocephalic . Face symmetric, atraumatic ?Lungs:  ?CTA B ?Normal respiratory effort, no intercostal retractions, no accessory muscle use. ?Heart: RRR,  no murmur.  ?Lower extremities: no pretibial edema bilaterally  ?Skin: Not pale. Not jaundice ?Neurologic:  ?alert & oriented X3.  ?Speech normal, gait appropriate for age and unassisted ?Psych--  ?Cognition and judgment appear intact.  ?Cooperative with normal attention span and concentration.  ?Behavior appropriate. ?No anxious or depressed appearing.  ? ?   ?Assessment   ? ?   ?Assessment ?Hypothyroidism ?Genital herpes, Valtrex ?Perimenopausal  ?Peanuts allergy-- epipen ?BCC, Moh's  surgery 2009,  Sees dermatology q year ?DOE, chest pain: CT Chest (-)  PE, normal stress echo, saw pulmonary ?Vit d def  ?Hepatic asteatosis. Per [place] 02-2021 ?covid ~ 03/2019 ?  ?PLAN ?Hypothyroidism: Good compliance with levothyroxine, takes it correctly away from vitamins.  Check TSH ?Vitamin D deficiency: Had ergocalciferol, taking OTC vitamin D most days. ?Check levels. ?Preventive care: Could take Shingrix, not inclined to do so at this point. ?RTC 6 months CPX ?  ?This visit occurred during the SARS-CoV-2 public health emergency.  Safety protocols were in place, including screening questions prior to the visit, additional usage of staff PPE, and extensive cleaning of exam room while observing appropriate contact time as indicated for disinfecting solutions.  ? ?

## 2021-10-19 MED ORDER — VITAMIN D (ERGOCALCIFEROL) 1.25 MG (50000 UNIT) PO CAPS: 50000.0000 [IU] | ORAL_CAPSULE | ORAL | 0 refills | Status: DC

## 2021-10-19 NOTE — Addendum Note (Signed)
Addended byDamita Dunnings D on: 10/19/2021 01:11 PM ? ? Modules accepted: Orders ? ?

## 2022-03-08 LAB — HM MAMMOGRAPHY

## 2022-03-11 ENCOUNTER — Encounter: Payer: Self-pay | Admitting: Internal Medicine

## 2022-03-25 ENCOUNTER — Encounter: Payer: Self-pay | Admitting: Internal Medicine

## 2022-03-26 MED ORDER — LEVOTHYROXINE SODIUM 100 MCG PO TABS: 100.0000 ug | ORAL_TABLET | Freq: Every day | ORAL | 2 refills | Status: DC

## 2022-04-14 ENCOUNTER — Encounter: Payer: Self-pay | Admitting: Internal Medicine

## 2022-04-15 MED ORDER — VALACYCLOVIR HCL 1 G PO TABS: 500.0000 mg | ORAL_TABLET | Freq: Three times a day (TID) | ORAL | 1 refills | Status: DC

## 2022-05-07 ENCOUNTER — Ambulatory Visit (INDEPENDENT_AMBULATORY_CARE_PROVIDER_SITE_OTHER): Payer: 59 | Admitting: Internal Medicine

## 2022-05-07 ENCOUNTER — Encounter: Payer: Self-pay | Admitting: Internal Medicine

## 2022-05-07 VITALS — BP 120/82 | HR 72 | Temp 97.8°F | Resp 16 | Ht 63.0 in | Wt 162.4 lb

## 2022-05-07 DIAGNOSIS — Z0000 Encounter for general adult medical examination without abnormal findings: Secondary | ICD-10-CM

## 2022-05-07 DIAGNOSIS — E559 Vitamin D deficiency, unspecified: Secondary | ICD-10-CM | POA: Diagnosis not present

## 2022-05-07 DIAGNOSIS — E039 Hypothyroidism, unspecified: Secondary | ICD-10-CM | POA: Diagnosis not present

## 2022-05-07 LAB — LIPID PANEL
Cholesterol: 176 mg/dL (ref 0–200)
HDL: 53.1 mg/dL (ref >39.00)
LDL Cholesterol: 100 mg/dL — ABNORMAL HIGH (ref 0–99)
NonHDL: 123.28
Total CHOL/HDL Ratio: 3
Triglycerides: 118 mg/dL (ref 0.0–149.0)
VLDL: 23.6 mg/dL (ref 0.0–40.0)

## 2022-05-07 LAB — COMPREHENSIVE METABOLIC PANEL
ALT: 21 U/L (ref 0–35)
AST: 20 U/L (ref 0–37)
Albumin: 4.6 g/dL (ref 3.5–5.2)
Alkaline Phosphatase: 85 U/L (ref 39–117)
BUN: 14 mg/dL (ref 6–23)
CO2: 29 mEq/L (ref 19–32)
Calcium: 9.2 mg/dL (ref 8.4–10.5)
Chloride: 105 mEq/L (ref 96–112)
Creatinine, Ser: 0.65 mg/dL (ref 0.40–1.20)
GFR: 95.89 mL/min (ref >60.00)
Glucose, Bld: 85 mg/dL (ref 70–99)
Potassium: 4.8 mEq/L (ref 3.5–5.1)
Sodium: 140 mEq/L (ref 135–145)
Total Bilirubin: 2.4 mg/dL — ABNORMAL HIGH (ref 0.2–1.2)
Total Protein: 6.7 g/dL (ref 6.0–8.3)

## 2022-05-07 LAB — VITAMIN D 25 HYDROXY (VIT D DEFICIENCY, FRACTURES): VITD: 26.72 ng/mL — ABNORMAL LOW (ref 30.00–100.00)

## 2022-05-07 LAB — TSH: TSH: 0.97 u[IU]/mL (ref 0.35–5.50)

## 2022-05-07 NOTE — Assessment & Plan Note (Signed)
Here for CPX Hypothyroidism: On levothyroxine.  Checking labs. Vitamin D deficiency: Had ergocalciferol months ago, takes vitamin D, OTC.  Compliance is not 100%.  Recommend better compliance checking labs. RTC 1 year

## 2022-05-07 NOTE — Assessment & Plan Note (Signed)
-  Td 2022 - Shingrix RSV, COVID shot, flu shot: All discussed with the patient,  pros>cons --CCS: Cscope x 2 ,  06-2005 (in [facility]) they found polyps Cscope 2008 normal.   cscope 02-2012, (-) cscope 04/18/2017, Dr Norville Haggard , no polyps, + diverticuli Has a appointment schedule w/ GI next week   --female care:  sees gyn ([place])  MMG : [phone]  DEXA @ gyn, ?osteopenia, rec to d/w gyn next DEXA --Labs: CMP FLP TSH vitamin D --Diet-exercise:  eats healthy, exercises regularly. - H-POA info provided

## 2022-05-07 NOTE — Progress Notes (Signed)
Subjective:    Patient ID: Rachel Maynard, female    DOB: 1960/08/09, 61 y.o.   MRN: [id number]  DOS:  05/07/2022 Type of visit - description: cpx  Doing well, no concerns   Review of Systems  A 14 point review of systems is negative     Past Medical History:  Diagnosis Date   BCC (basal cell carcinoma of skin)     s/p MOHs 2009, sees derm   Colon polyps    Dyspnea    DOE- CP, 3-10 normal stress  ECHO, 4-10 CT neg for PE, saw pulmonary   Genital herpes    On Valtrex   Hypothyroidism     Past Surgical History:  Procedure Laterality Date   APPENDECTOMY  1980   CESAREAN SECTION  2012   CHOLECYSTECTOMY  2004   SHOULDER ARTHROSCOPY Left 05/2018   Uterine ablation     Social History   Socioeconomic History   Marital status: Divorced    Spouse name: Not on file   Number of children: 1   Years of education: Not on file   Highest education level: Not on file  Occupational History   Occupation: Publishing rights manager, sedentary    Employer: [employer]  Tobacco Use   Smoking status: Never   Smokeless tobacco: Never  Substance and Sexual Activity   Alcohol use: Yes    Alcohol/week: 0.0 standard drinks of alcohol    Comment: Rare   Drug use: No   Sexual activity: Not Currently    Birth control/protection: None  Other Topics Concern   Not on file  Social History Narrative   Single    Son, 2001,  dx w/ Asperger's  highly funtional, to graduate from college          Social Determinants of Health   Financial Resource Strain: Not on file  Food Insecurity: Not on file  Transportation Needs: Not on file  Physical Activity: Not on file  Stress: Not on file  Social Connections: Not on file  Intimate Partner Violence: Not on file    Current Outpatient Medications  Medication Instructions   cholecalciferol (VITAMIN D3) 2,000 Units, Oral, Daily   levothyroxine (SYNTHROID) 100 mcg, Oral, Daily before breakfast   valACYclovir (VALTREX) 500 mg, Oral, 3 times daily, For  3 days per outbreak       Objective:   Physical Exam BP 120/82   Pulse 72   Temp 97.8 F (36.6 C) (Oral)   Resp 16   Ht '5\' 3"'$  (1.6 m)   Wt 162 lb 6 oz (73.7 kg)   SpO2 98%   BMI 28.76 kg/m  General: Well developed, NAD, BMI noted Neck: No  thyromegaly  HEENT:  Normocephalic . Face symmetric, atraumatic Lungs:  CTA B Normal respiratory effort, no intercostal retractions, no accessory muscle use. Heart: RRR,  no murmur.  Abdomen:  Not distended, soft, non-tender. No rebound or rigidity.   Lower extremities: no pretibial edema bilaterally  Skin: Exposed areas without rash. Not pale. Not jaundice Neurologic:  alert & oriented X3.  Speech normal, gait appropriate for age and unassisted Strength symmetric and appropriate for age.  Psych: Cognition and judgment appear intact.  Cooperative with normal attention span and concentration.  Behavior appropriate. No anxious or depressed appearing.     Assessment    Assessment Hypothyroidism Genital herpes, Valtrex Menopausal  Peanuts allergy-- epipen BCC, Moh's  surgery 2009,  Sees dermatology q year DOE, chest pain: CT Chest (-)  PE,  normal stress echo, saw pulmonary Vit d def  Hepatic asteatosis. Per [place] [id number]    PLAN Here for CPX Hypothyroidism: On levothyroxine.  Checking labs. Vitamin D deficiency: Had ergocalciferol months ago, takes vitamin D, OTC.  Compliance is not 100%.  Recommend better compliance checking labs. RTC 1 year

## 2022-05-07 NOTE — Patient Instructions (Signed)
Take vitamin D over-the-counter 2000 units regularly  Please read the information about a healthcare power of attorney  GO TO THE LAB : Get the blood work     [place], [place] back for a physical exam in 1 year     Do you have a "Living will" or "[place] of attorney"? (Advance care planning documents)  If you already have a living will or healthcare power of attorney, is recommended you bring the copy to be scanned in your chart. The document will be available to all the doctors you see in the system.  If you don't have one, please consider create one.  Advance directives can be documented in many types of formats,  documents have names such as:  Lliving will  Durable power of attorney for healthcare (healthcare proxy or healthcare power of attorney)  Combined directives  Physician orders for life-sustaining treatment    More information at:  [email or web address]

## 2022-05-09 MED ORDER — VITAMIN D (ERGOCALCIFEROL) 1.25 MG (50000 UNIT) PO CAPS: 50000.0000 [IU] | ORAL_CAPSULE | ORAL | 0 refills | Status: DC

## 2022-05-09 NOTE — Addendum Note (Signed)
Addended byDamita Dunnings D on: 05/09/2022 10:49 AM   Modules accepted: Orders

## 2022-07-29 ENCOUNTER — Ambulatory Visit (INDEPENDENT_AMBULATORY_CARE_PROVIDER_SITE_OTHER): Payer: 59 | Admitting: Internal Medicine

## 2022-07-29 ENCOUNTER — Encounter: Payer: Self-pay | Admitting: Internal Medicine

## 2022-07-29 VITALS — BP 120/74 | HR 78 | Temp 98.3°F | Resp 16 | Ht 63.0 in | Wt 166.1 lb

## 2022-07-29 DIAGNOSIS — S99922D Unspecified injury of left foot, subsequent encounter: Secondary | ICD-10-CM

## 2022-07-29 NOTE — Patient Instructions (Addendum)
Will refer you to sports medicine group ([facility]).   Vaccines I recommend:  Covid booster Covid booster RSV vaccine Shingrix (shingles)

## 2022-07-29 NOTE — Progress Notes (Signed)
Subjective:    Patient ID: Rachel Maynard, female    DOB: 01-01-1961, 62 y.o.   MRN: [id number]  DOS:  07/29/2022 Type of visit - description: acute  07/01/2022: Saw Ortho, the day prior she injured  the fourth L toe  Had swelling and bruising at the area. Had a  Rx x-ray , recommended a buddy tape.  Seen again 07/15/2022, at the time she had tenderness along the  metatarsal area,  ankle ROM normal, she had a 3 view weightbearing x-rays, no fracture or stress fracture noted per OV Was Rx immobilization  She is here because continue with pain, pain is actually worse after rest and better after she does some walking. Pain is located at the base of the fourth toe.  This morning, the foot got red but without swelling.  See pictures.  Foot is back to normal.  Review of Systems See above   Past Medical History:  Diagnosis Date   BCC (basal cell carcinoma of skin)     s/p MOHs 2009, sees derm   Colon polyps    Dyspnea    DOE- CP, 3-10 normal stress  ECHO, 4-10 CT neg for PE, saw pulmonary   Genital herpes    On Valtrex   Hypothyroidism     Past Surgical History:  Procedure Laterality Date   APPENDECTOMY  1980   CESAREAN SECTION  2012   CHOLECYSTECTOMY  2004   SHOULDER ARTHROSCOPY Left 05/2018   Uterine ablation      Current Outpatient Medications  Medication Instructions   cholecalciferol (VITAMIN D3) 2,000 Units, Oral, Daily   levothyroxine (SYNTHROID) 100 mcg, Oral, Daily before breakfast   valACYclovir (VALTREX) 500 mg, Oral, 3 times daily, For 3 days per outbreak   Vitamin D (Ergocalciferol) (DRISDOL) 50,000 Units, Oral, Every 7 days       Objective:   Physical Exam BP 120/74   Pulse 78   Temp 98.3 F (36.8 C) (Oral)   Resp 16   Ht '5\' 3"'$  (1.6 m)   Wt 166 lb 2 oz (75.4 kg)   SpO2 98%   BMI 29.43 kg/m  General:   Well developed, NAD, BMI noted. HEENT:  Normocephalic . Face symmetric, atraumatic Lower extremity: R foot: Normal F foot: Normal to inspection,  no deformities, the fourth toe is slightly TTP but no deformities.  The metatarsal phalange joints are non-TTP actually. Toes are otherwise well-perfused and she has good pedal pulse. Calves are soft and not tender Neurologic:  alert & oriented X3.  Speech normal, gait appropriate for age and unassisted Psych--  Cognition and judgment appear intact.  Cooperative with normal attention span and concentration.  Behavior appropriate. No anxious or depressed appearing.   Initial injury    Redness without swelling this morning      Assessment     Assessment Hypothyroidism Genital herpes, Valtrex Menopausal  Peanuts allergy-- epipen BCC, Moh's  surgery 2009,  Sees dermatology q year DOE, chest pain: CT Chest (-)  PE, normal stress echo, saw pulmonary Vit d def  Hepatic asteatosis. Per [place] 02-2021    PLAN L foot injury: See HPI for details, she injured her her left foot almost a month ago, saw Ortho twice, x-ray twice, no fractures. She continue with pain, the left foot turning red yesterday and today but no swelling.  See picture.  Vascular exam is normal Plan: Refer to sports medicine, may need MRI and further evaluation.  She is using a boot, okay  to continue with it.

## 2022-07-29 NOTE — Assessment & Plan Note (Signed)
L foot injury: See HPI for details, she injured her her left foot almost a month ago, saw Ortho twice, x-ray twice, no fractures. She continue with pain, the left foot turning red yesterday and today but no swelling.  See picture.  Vascular exam is normal Plan: Refer to sports medicine, may need MRI and further evaluation.  She is using a boot, okay to continue with it.

## 2022-08-09 ENCOUNTER — Encounter: Payer: Self-pay | Admitting: Family Medicine

## 2022-08-19 LAB — HM COLONOSCOPY

## 2022-10-27 ENCOUNTER — Other Ambulatory Visit: Payer: Self-pay | Admitting: Internal Medicine

## 2022-11-28 ENCOUNTER — Ambulatory Visit: Payer: 59 | Admitting: Family Medicine

## 2022-11-28 VITALS — BP 117/67 | HR 64 | Temp 98.5°F | Resp 18 | Ht 63.0 in | Wt 164.8 lb

## 2022-11-28 DIAGNOSIS — E559 Vitamin D deficiency, unspecified: Secondary | ICD-10-CM

## 2022-11-28 DIAGNOSIS — R052 Subacute cough: Secondary | ICD-10-CM

## 2022-11-28 MED ORDER — VITAMIN D (ERGOCALCIFEROL) 1.25 MG (50000 UNIT) PO CAPS: 50000.0000 [IU] | ORAL_CAPSULE | ORAL | 1 refills | Status: DC

## 2022-11-28 MED ORDER — PREDNISONE 10 MG PO TABS: ORAL_TABLET | ORAL | 0 refills | Status: DC

## 2022-11-28 NOTE — Progress Notes (Addendum)
Subjective:   By signing my name below, I, Pura Spice, attest that this documentation has been prepared under the direction and in the presence of Donato Schultz, DO 11/28/22   Patient ID: Rachel Maynard, female    DOB: [birth year], 61 y.o.   MRN: [id number]  Chief Complaint  Patient presents with   Lingering cough    Pt says she went on a Cruise on 5/24, she ended up seeing the cruise ship Doctor. She was exposed to smoke on the bus (she is allergic to smoke). She was prescribed Zpak , Claritin. Son was with her and was Dx with influenza.    HPI Patient is in today for evaluation of a lingering cough. She was seen by cruise ship doctor on over a week ago as noted above. Her symptoms began after secondhand smoke exposure. She took Claritin and completed a Zpak but still has a persistent cough. She denies any sputum production. Her son also went on the cruise and was diagnosed with the flu.  Past Medical History:  Diagnosis Date   BCC (basal cell carcinoma of skin)     s/p MOHs 2009, sees derm   Colon polyps    Dyspnea    DOE- CP, 3-10 normal stress  ECHO, 4-10 CT neg for PE, saw pulmonary   Genital herpes    On Valtrex   Hypothyroidism     Past Surgical History:  Procedure Laterality Date   APPENDECTOMY  1980   CESAREAN SECTION  2012   CHOLECYSTECTOMY  2004   SHOULDER ARTHROSCOPY Left 05/2018   Uterine ablation      Family History  Problem Relation Age of Onset   Hypertension Father    Heart failure Father    Heart disease Father 53       MI   Colon polyps Father    Transient ischemic attack Father    Alzheimer's disease Mother    Osteoporosis Sister    Diabetes Other        mother side    Colon cancer Other        father side   Lymphoma Other        uncle, aunt F side    Breast cancer Neg Hx     Social History   Socioeconomic History   Marital status: Divorced    Spouse name: Not on file   Number of children: 1   Years of education: Not on file    Highest education level: Master's degree (e.g., MA, MS, MEng, MEd, MSW, MBA)  Occupational History   Occupation: Finances, sedentary    Employer: [employer]  Tobacco Use   Smoking status: Never   Smokeless tobacco: Never  Substance and Sexual Activity   Alcohol use: Yes    Alcohol/week: 0.0 standard drinks of alcohol    Comment: Rare   Drug use: No   Sexual activity: Not Currently    Birth control/protection: None  Other Topics Concern   Not on file  Social History Narrative   Single    Son, 2001,  dx w/ Asperger's  highly funtional, to graduate from college          Social Determinants of Health   Financial Resource Strain: Low Risk  (11/28/2022)   Overall Financial Resource Strain (CARDIA)    Difficulty of Paying Living Expenses: Not very hard  Food Insecurity: No Food Insecurity (11/28/2022)   Hunger Vital Sign    Worried About Running Out of  Food in the Last Year: Never true    Ran Out of Food in the Last Year: Never true  Transportation Needs: No Transportation Needs (11/28/2022)   PRAPARE - Administrator, Civil Service (Medical): No    Lack of Transportation (Non-Medical): No  Physical Activity: Insufficiently Active (11/28/2022)   Exercise Vital Sign    Days of Exercise per Week: 2 days    Minutes of Exercise per Session: 10 min  Stress: Stress Concern Present (11/28/2022)   [employer] of Occupational Health - Occupational Stress Questionnaire    Feeling of Stress : To some extent  Social Connections: Moderately Isolated (11/28/2022)   Social Connection and Isolation Panel [NHANES]    Frequency of Communication with Friends and Family: More than three times a week    Frequency of Social Gatherings with Friends and Family: Once a week    Attends Religious Services: More than 4 times per year    Active Member of [employer] or Organizations: No    Attends Engineer, structural: Not on file    Marital Status: Divorced  Intimate Partner  Violence: Not on file    Outpatient Medications Prior to Visit  Medication Sig Dispense Refill   cholecalciferol (VITAMIN D3) 25 MCG (1000 UNIT) tablet Take 2,000 Units by mouth daily.     levothyroxine (SYNTHROID) 100 MCG tablet Take 1 tablet (100 mcg total) by mouth daily before breakfast. 90 tablet 2   loratadine (CLARITIN) 10 MG tablet Take 10 mg by mouth daily.     valACYclovir (VALTREX) 1000 MG tablet Take 0.5 tablets (500 mg total) by mouth 3 (three) times daily. For 3 days per outbreak 90 tablet 1   Vitamin D, Ergocalciferol, (DRISDOL) 1.25 MG (50000 UNIT) CAPS capsule Take 1 capsule (50,000 Units total) by mouth every 7 (seven) days. 12 capsule 0   No facility-administered medications prior to visit.    Allergies  Allergen Reactions   Guaifenesin Er     Other reaction(s): Hallucinations   Midazolam Nausea And Vomiting    Review of Systems  Constitutional:  Negative for fever and malaise/fatigue.  HENT:  Negative for congestion, sinus pain and sore throat.   Eyes:  Negative for blurred vision.  Respiratory:  Positive for cough and wheezing. Negative for shortness of breath.   Cardiovascular:  Negative for chest pain, palpitations and leg swelling.  Gastrointestinal:  Negative for abdominal pain, blood in stool, constipation, diarrhea, nausea and vomiting.  Genitourinary:  Negative for dysuria, frequency and hematuria.  Musculoskeletal:  Negative for falls.  Skin:  Negative for rash.  Neurological:  Negative for dizziness, loss of consciousness and headaches.  Endo/Heme/Allergies:  Negative for environmental allergies.  Psychiatric/Behavioral:  Negative for depression. The patient is not nervous/anxious.        Objective:    Physical Exam Vitals and nursing note reviewed.  Constitutional:      General: She is not in acute distress.    Appearance: Normal appearance. She is not ill-appearing.  HENT:     Head: Normocephalic and atraumatic.     Comments: Postnasal  drip    Right Ear: External ear normal.     Left Ear: External ear normal.  Eyes:     Extraocular Movements: Extraocular movements intact.     Pupils: Pupils are equal, round, and reactive to light.  Cardiovascular:     Rate and Rhythm: Normal rate and regular rhythm.     Heart sounds: Normal heart sounds. No murmur heard.  No gallop.  Pulmonary:     Effort: Pulmonary effort is normal. No respiratory distress.     Breath sounds: Decreased breath sounds and wheezing present. No rales.  Skin:    General: Skin is warm and dry.  Neurological:     General: No focal deficit present.     Mental Status: She is alert and oriented to person, place, and time.  Psychiatric:        Mood and Affect: Mood normal.        Judgment: Judgment normal.     BP 117/67 (BP Location: Left Arm, Patient Position: Sitting, Cuff Size: Normal)   Pulse 64   Temp 98.5 F (36.9 C) (Oral)   Resp 18   Ht 5\' 3"  (1.6 m)   Wt 164 lb 12.8 oz (74.8 kg)   SpO2 100%   BMI 29.19 kg/m  Wt Readings from Last 3 Encounters:  11/28/22 164 lb 12.8 oz (74.8 kg)  07/29/22 166 lb 2 oz (75.4 kg)  05/07/22 162 lb 6 oz (73.7 kg)       Assessment & Plan:  Subacute cough Assessment & Plan: Pred taper  Con't claritin  Cxr if no better   Orders: -     DG Chest 2 View; Future -     predniSONE; TAKE 3 TABLETS PO QD FOR 3 DAYS THEN TAKE 2 TABLETS PO QD FOR 3 DAYS THEN TAKE 1 TABLET PO QD FOR 3 DAYS THEN TAKE 1/2 TAB PO QD FOR 3 DAYS  Dispense: 20 tablet; Refill: 0  Vitamin D deficiency -     Vitamin D (Ergocalciferol); Take 1 capsule (50,000 Units total) by mouth every 7 (seven) days.  Dispense: 12 capsule; Refill: 1     I,Alexis Herring,acting as a scribe for [employer], DO.,have documented all relevant documentation on the behalf of Donato Schultz, DO,as directed by  Donato Schultz, DO while in the presence of Donato Schultz, DO.   I, Donato Schultz, DO, personally preformed the  services described in this documentation.  All medical record entries made by the scribe were at my direction and in my presence.  I have reviewed the chart and discharge instructions (if applicable) and agree that the record reflects my personal performance and is accurate and complete. 11/28/22   Donato Schultz, DO

## 2022-11-28 NOTE — Assessment & Plan Note (Signed)
Pred taper  Con't claritin  Cxr if no better

## 2022-12-02 ENCOUNTER — Encounter: Payer: Self-pay | Admitting: Internal Medicine

## 2022-12-03 ENCOUNTER — Other Ambulatory Visit: Payer: Self-pay | Admitting: Internal Medicine

## 2022-12-16 ENCOUNTER — Encounter: Payer: Self-pay | Admitting: Internal Medicine

## 2023-02-17 ENCOUNTER — Encounter: Payer: Self-pay | Admitting: Internal Medicine

## 2023-02-17 DIAGNOSIS — E639 Nutritional deficiency, unspecified: Secondary | ICD-10-CM

## 2023-02-17 DIAGNOSIS — Z91018 Allergy to other foods: Secondary | ICD-10-CM

## 2023-02-17 NOTE — Telephone Encounter (Signed)
Please arrange a referral to [facility] health nutrition and diabetes education services. They are located at [address].

## 2023-02-17 NOTE — Telephone Encounter (Signed)
Referral placed.

## 2023-02-17 NOTE — Addendum Note (Signed)
Addended byConrad Redfield D on: 02/17/2023 02:00 PM   Modules accepted: Orders

## 2023-02-24 ENCOUNTER — Encounter: Payer: Self-pay | Admitting: Internal Medicine

## 2023-03-12 LAB — HM MAMMOGRAPHY

## 2023-03-14 ENCOUNTER — Encounter: Payer: Self-pay | Admitting: Internal Medicine

## 2023-04-28 ENCOUNTER — Encounter: Payer: Self-pay | Admitting: Internal Medicine

## 2023-04-30 ENCOUNTER — Encounter: Payer: Self-pay | Admitting: Internal Medicine

## 2023-05-09 ENCOUNTER — Ambulatory Visit: Payer: 59 | Admitting: Internal Medicine

## 2023-05-09 ENCOUNTER — Encounter: Payer: Self-pay | Admitting: Internal Medicine

## 2023-05-09 VITALS — BP 122/70 | HR 68 | Temp 97.9°F | Resp 16 | Ht 63.0 in | Wt 166.1 lb

## 2023-05-09 DIAGNOSIS — E039 Hypothyroidism, unspecified: Secondary | ICD-10-CM | POA: Diagnosis not present

## 2023-05-09 DIAGNOSIS — E559 Vitamin D deficiency, unspecified: Secondary | ICD-10-CM

## 2023-05-09 DIAGNOSIS — Z0000 Encounter for general adult medical examination without abnormal findings: Secondary | ICD-10-CM | POA: Diagnosis not present

## 2023-05-09 LAB — LIPID PANEL
Cholesterol: 169 mg/dL (ref 0–200)
HDL: 52 mg/dL (ref >39.00)
LDL Cholesterol: 96 mg/dL (ref 0–99)
NonHDL: 117.24
Total CHOL/HDL Ratio: 3
Triglycerides: 106 mg/dL (ref 0.0–149.0)
VLDL: 21.2 mg/dL (ref 0.0–40.0)

## 2023-05-09 LAB — TSH: TSH: 0.83 u[IU]/mL (ref 0.35–5.50)

## 2023-05-09 LAB — VITAMIN D 25 HYDROXY (VIT D DEFICIENCY, FRACTURES): VITD: 27.53 ng/mL — ABNORMAL LOW (ref 30.00–100.00)

## 2023-05-09 NOTE — Progress Notes (Unsigned)
Subjective:    Patient ID: Rachel Maynard, female    DOB: 09-21-1960, 62 y.o.   MRN: [id number]  DOS:  05/09/2023 Type of visit - description: cpx  Here for CPX Saw GI, note reviewed. Still concerned about easy bleeding from the skin with minor cuts or injuries. Denies any blood in the stools or urine.   Review of Systems  Other than above, a 14 point review of systems is negative     Past Medical History:  Diagnosis Date   BCC (basal cell carcinoma of skin)     s/p MOHs 2009, sees derm   Colon polyps    Dyspnea    DOE- CP, 3-10 normal stress  ECHO, 4-10 CT neg for PE, saw pulmonary   Genital herpes    On Valtrex   Hypothyroidism     Past Surgical History:  Procedure Laterality Date   APPENDECTOMY  1980   CESAREAN SECTION  2012   CHOLECYSTECTOMY  2004   SHOULDER ARTHROSCOPY Left 05/2018   Uterine ablation      Current Outpatient Medications  Medication Instructions   cholecalciferol (VITAMIN D3) 2,000 Units, Daily   levothyroxine (SYNTHROID) 100 mcg, Oral, Daily before breakfast   loratadine (CLARITIN) 10 mg, Oral, Daily   valACYclovir (VALTREX) 500 mg, Oral, 3 times daily, For 3 days per outbreak   Social History   Socioeconomic History   Marital status: Divorced    Spouse name: Not on file   Number of children: 1   Years of education: Not on file   Highest education level: Master's degree (e.g., MA, MS, MEng, MEd, MSW, MBA)  Occupational History   Occupation: Finances, sedentary    Employer: [employer]  Tobacco Use   Smoking status: Never   Smokeless tobacco: Never  Substance and Sexual Activity   Alcohol use: Yes    Alcohol/week: 0.0 standard drinks of alcohol    Comment: Rare   Drug use: No   Sexual activity: Not Currently    Birth control/protection: None  Other Topics Concern   Not on file  Social History Narrative   Single    Son, 2001,  dx w/ Asperger's  highly funtional,  graduated from college, lives in [place]           Social Determinants of Health   Financial Resource Strain: Low Risk  (11/28/2022)   Overall Financial Resource Strain (CARDIA)    Difficulty of Paying Living Expenses: Not very hard  Food Insecurity: No Food Insecurity (11/28/2022)   Hunger Vital Sign    Worried About Running Out of Food in the Last Year: Never true    Ran Out of Food in the Last Year: Never true  Transportation Needs: No Transportation Needs (11/28/2022)   PRAPARE - Administrator, Civil Service (Medical): No    Lack of Transportation (Non-Medical): No  Physical Activity: Insufficiently Active (11/28/2022)   Exercise Vital Sign    Days of Exercise per Week: 2 days    Minutes of Exercise per Session: 10 min  Stress: Stress Concern Present (11/28/2022)   [employer] of Occupational Health - Occupational Stress Questionnaire    Feeling of Stress : To some extent  Social Connections: Moderately Isolated (11/28/2022)   Social Connection and Isolation Panel [NHANES]    Frequency of Communication with Friends and Family: More than three times a week    Frequency of Social Gatherings with Friends and Family: Once a week    Attends Religious  Services: More than 4 times per year    Active Member of Clubs or Organizations: No    Attends Banker Meetings: Not on file    Marital Status: Divorced  Intimate Partner Violence: Unknown (09/24/2021)   Received from [facility]   HITS    Physically Hurt: Not on file    Insult or Talk Down To: Not on file    Threaten Physical Harm: Not on file    Scream or Curse: Not on file        Objective:   Physical Exam BP 122/70   Pulse 68   Temp 97.9 F (36.6 C) (Oral)   Resp 16   Ht 5\' 3"  (1.6 m)   Wt 166 lb 2 oz (75.4 kg)   SpO2 98%   BMI 29.43 kg/m  General: Well developed, NAD, BMI noted Neck: No  thyromegaly  HEENT:  Normocephalic . Face symmetric, atraumatic Lungs:  CTA B Normal respiratory effort, no intercostal retractions, no  accessory muscle use. Heart: RRR,  no murmur.  Abdomen:  Not distended, soft, non-tender. No rebound or rigidity.   Lower extremities: no pretibial edema bilaterally  Skin: Skin is thin but otherwise normal Neurologic:  alert & oriented X3.  Speech normal, gait appropriate for age and unassisted Strength symmetric and appropriate for age.  Psych: Cognition and judgment appear intact.  Cooperative with normal attention span and concentration.  Behavior appropriate. No anxious or depressed appearing.     Assessment    Assessment Hypothyroidism Genital herpes, Valtrex Menopausal  Peanuts allergy-- epipen BCC, Moh's  surgery 2009,  Sees dermatology q year Vit d def  Hepatic asteatosis, per [place] 02-2021; [facility].  DOE, chest pain: CT Chest (-)  PE, normal stress echo, saw pulmonary   PLAN Here for CPX -Td 2022 - s/p  Shingrix   - rec  flu shot, covid vax   pros>cons --CCS: Cscope x 2 ,  06-2005 (in [facility]) they found polyps Cscope 2008 normal.   cscope 02-2012, (-) cscope 04/18/2017, Dr Baldwin [place] , no polyps, + diverticuli Cscope 07/2022 --female care:  sees gyn [facility] [place])  MMG : 02-2023  DEXA @ gyn, ?osteopenia, per gyn   --Labs:  Labs 04/24/2023 creatinine 0.6.  AST ALT normal.  Bilirubin 1.4.  Hemoglobin 13.8.   Check a FLP TSH and vitamin D --Diet-exercise: Encouraged healthy diet and regular exercise - H-POA info provided Hypothyroidism: On Synthroid, check TSH Vitamin D deficiency: Currently forgets to take OTCs, recommend to do, check levels. 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  Patient reassured RTC 6 months

## 2023-05-09 NOTE — Patient Instructions (Addendum)
Vaccines I recommend: Flu shot  Covid booster  Please remember to take vitamin D 2000 units every day    GO TO THE LAB : Get the blood work     Next visit with me in 6 months, routine checkup Please schedule it at the front desk      "Health Care Power of attorney" ,  "Living will" (Advance care planning documents)  If you already have a living will or healthcare power of attorney, is recommended you bring the copy to be scanned in your chart.   The document will be available to all the doctors you see in the system.  Advance care planning is a process that supports adults in  understanding and sharing their preferences regarding future medical care.  The patient's preferences are recorded in documents called Advance Directives and the can be modified at any time while the patient is in full mental capacity.   If you don't have one, please consider create one.      More information at: [email or web address]

## 2023-05-10 ENCOUNTER — Encounter: Payer: Self-pay | Admitting: Internal Medicine

## 2023-05-10 NOTE — Assessment & Plan Note (Signed)
Here for CPX Hypothyroidism: On Synthroid, check TSH Vitamin D deficiency: Currently forgets to take OTCs, recommend to do, check levels. 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  Patient reassured RTC 6 months

## 2023-05-10 NOTE — Assessment & Plan Note (Signed)
Here for CPX -Td 2022 - s/p  Shingrix   - rec  flu shot, covid vax   pros>cons --CCS: Cscope x 2 ,  06-2005 (in [facility]) they found polyps Cscope 2008 normal.   cscope 02-2012, (-) cscope 04/18/2017, Dr Baldwin [place] , no polyps, + diverticuli Cscope 07/2022 --female care:  sees gyn [facility] [place])  MMG : 02-2023  DEXA @ gyn, ?osteopenia, per gyn   --Labs:  Labs 04/24/2023 creatinine 0.6.  AST ALT normal.  Bilirubin 1.4.  Hemoglobin 13.8.   Check a FLP TSH and vitamin D --Diet-exercise: Encouraged healthy diet and regular exercise - H-POA info provided

## 2023-05-12 MED ORDER — VITAMIN D (ERGOCALCIFEROL) 1.25 MG (50000 UNIT) PO CAPS: 50000.0000 [IU] | ORAL_CAPSULE | ORAL | 0 refills | Status: AC

## 2023-05-12 NOTE — Addendum Note (Signed)
Addended byConrad Hackberry D on: 05/12/2023 07:49 AM   Modules accepted: Orders

## 2023-06-02 ENCOUNTER — Other Ambulatory Visit: Payer: Self-pay | Admitting: Internal Medicine

## 2023-07-17 ENCOUNTER — Other Ambulatory Visit: Payer: Self-pay | Admitting: Internal Medicine

## 2023-07-22 ENCOUNTER — Ambulatory Visit: Payer: Self-pay | Admitting: Internal Medicine

## 2023-07-22 NOTE — Telephone Encounter (Signed)
Copied from CRM [id number]. Topic: Clinical - Medical Advice >> Jul 22, 2023  1:30 PM Samuel Jester B wrote: Reason for CRM: Pt stated that she has been having a stomach bug, and was prescribed with loperanmide 2mg  for diarrhea and took one pill. She said she has not had a bowel movment since friday. She would like a callback cause she is not sure what to do.  Triage Nurse 1st attempt to contact pt, no answer or ringing, LVM for call back to office to assess further. Placed in call back.

## 2023-07-22 NOTE — Telephone Encounter (Signed)
Chief Complaint: constipation Symptoms: no BM in 4 days Frequency: na  Disposition: [] ED /[] Urgent Care (no appt availability in office) / [] Appointment(In office/virtual)/ []  [place] [facility]/ [x] Home Care/ [] Refused Recommended Disposition /[] [place] [facility]/ []  Follow-up with PCP Additional Notes: Pt complaining of constipation after one dose of imodium on Friday. Pt had diarrhea and was prescribed that medication. Pt does not have regular BM. Pt typically goes once every 2-3 days.  Pt was asking for home care remedies and how much longer should she would need to  wait before being seen. RN gave care advice to increase movement, fiber, water intake,  and drink prune juice/hot tea. Pt verbalized understanding.               Copied from CRM [id number]. Topic: Clinical - Medical Advice >> Jul 22, 2023  1:30 PM Samuel Jester B wrote: Reason for CRM: Pt stated that she has been having a stomach bug, and was prescribed with loperanmide 2mg  for diarrhea and took one pill. She said she has not had a bowel movment since friday. She would like a callback cause she is not sure what to do. Reason for Disposition  MILD constipation  Answer Assessment - Initial Assessment Questions 1. STOOL PATTERN OR FREQUENCY: "How often do you have a bowel movement (BM)?"  (Normal range: 3 times a day to every 3 days)  "When was your last BM?"       Varies-1x every couple days. 2. STRAINING: "Do you have to strain to have a BM?"      Denies  3. RECTAL PAIN: "Does your rectum hurt when the stool comes out?" If Yes, ask: "Do you have hemorrhoids? How bad is the pain?"  (Scale 1-10; or mild, moderate, severe)     Denies  4. STOOL COMPOSITION: "Are the stools hard?"      Depends on diet 5. BLOOD ON STOOLS: "Has there been any blood on the toilet tissue or on the surface of the BM?" If Yes, ask: "When was the last time?"     denies 6. CHRONIC CONSTIPATION: "Is this a new problem for you?"  If No, ask:  "How long have you had this problem?" (days, weeks, months)      yes 7. CHANGES IN DIET OR HYDRATION: "Have there been any recent changes in your diet?" "How much fluids are you drinking on a daily basis?"  "How much have you had to drink today?"     Denies; 1 c of coffee; 3-4 green tea; 1 soda; little bit  of water 8. MEDICINES: "Have you been taking any new medicines?" "Are you taking any narcotic pain medicines?" (e.g., Dilaudid, morphine, Percocet, Vicodin)     Imodium one time; took antinausea pill Saturday morning 9. LAXATIVES: "Have you been using any stool softeners, laxatives, or enemas?"  If Yes, ask "What, how often, and when was the last time?"     na 10. ACTIVITY:  "How much walking do you do every day?"  "Has your activity level decreased in the past week?"        Seated mostly  11. CAUSE: "What do you think is causing the constipation?"       Imodium once 12. OTHER SYMPTOMS: "Do you have any other symptoms?" (e.g., abdomen pain, bloating, fever, vomiting)       burping 13. MEDICAL HISTORY: "Do you have a history of hemorrhoids, rectal fissures, or rectal surgery or rectal abscess?"  1 hemorrhoid  Protocols used: Constipation-A-AH

## 2023-08-26 ENCOUNTER — Ambulatory Visit: Admitting: Internal Medicine

## 2023-10-29 ENCOUNTER — Encounter: Payer: Self-pay | Admitting: Internal Medicine

## 2023-10-29 DIAGNOSIS — Z8249 Family history of ischemic heart disease and other diseases of the circulatory system: Secondary | ICD-10-CM

## 2023-10-30 NOTE — Addendum Note (Signed)
 Addended by: Mickeal Daws D on: 10/30/2023 10:52 AM   Modules accepted: Orders

## 2023-10-30 NOTE — Telephone Encounter (Signed)
 CT ordered.

## 2023-10-30 NOTE — Telephone Encounter (Signed)
 Please arrange for coronary calcium score, + FH CAD

## 2023-11-11 ENCOUNTER — Ambulatory Visit (HOSPITAL_BASED_OUTPATIENT_CLINIC_OR_DEPARTMENT_OTHER)
Admission: RE | Admit: 2023-11-11 | Discharge: 2023-11-11 | Disposition: A | Discharge status: Home / Self Care | Payer: Self-pay | Source: Ambulatory Visit | Attending: Internal Medicine | Admitting: Internal Medicine

## 2023-11-11 ENCOUNTER — Encounter: Payer: Self-pay | Admitting: Internal Medicine

## 2023-11-11 ENCOUNTER — Ambulatory Visit: Payer: 59 | Admitting: Internal Medicine

## 2023-11-11 VITALS — BP 118/66 | HR 51 | Temp 98.1°F | Resp 16 | Ht 63.0 in | Wt 165.4 lb

## 2023-11-11 DIAGNOSIS — E559 Vitamin D deficiency, unspecified: Secondary | ICD-10-CM

## 2023-11-11 DIAGNOSIS — D698 Other specified hemorrhagic conditions: Secondary | ICD-10-CM

## 2023-11-11 DIAGNOSIS — E039 Hypothyroidism, unspecified: Secondary | ICD-10-CM | POA: Diagnosis not present

## 2023-11-11 DIAGNOSIS — Z8249 Family history of ischemic heart disease and other diseases of the circulatory system: Secondary | ICD-10-CM | POA: Insufficient documentation

## 2023-11-11 LAB — COMPREHENSIVE METABOLIC PANEL WITH GFR
ALT: 15 U/L (ref 0–35)
AST: 20 U/L (ref 0–37)
Albumin: 4.5 g/dL (ref 3.5–5.2)
Alkaline Phosphatase: 76 U/L (ref 39–117)
BUN: 15 mg/dL (ref 6–23)
CO2: 27 meq/L (ref 19–32)
Calcium: 9.2 mg/dL (ref 8.4–10.5)
Chloride: 107 meq/L (ref 96–112)
Creatinine, Ser: 0.57 mg/dL (ref 0.40–1.20)
GFR: 97.93 mL/min (ref >60.00)
Glucose, Bld: 82 mg/dL (ref 70–99)
Potassium: 3.8 meq/L (ref 3.5–5.1)
Sodium: 142 meq/L (ref 135–145)
Total Bilirubin: 2.1 mg/dL — ABNORMAL HIGH (ref 0.2–1.2)
Total Protein: 6.9 g/dL (ref 6.0–8.3)

## 2023-11-11 LAB — CBC WITH DIFFERENTIAL/PLATELET
Basophils Absolute: 0 10*3/uL (ref 0.0–0.1)
Basophils Relative: 0.9 % (ref 0.0–3.0)
Eosinophils Absolute: 0.2 10*3/uL (ref 0.0–0.7)
Eosinophils Relative: 2.7 % (ref 0.0–5.0)
HCT: 38.6 % (ref 36.0–46.0)
Hemoglobin: 13.5 g/dL (ref 12.0–15.0)
Lymphocytes Relative: 39.3 % (ref 12.0–46.0)
Lymphs Abs: 2.2 10*3/uL (ref 0.7–4.0)
MCHC: 35 g/dL (ref 30.0–36.0)
MCV: 88.9 fl (ref 78.0–100.0)
Monocytes Absolute: 0.4 10*3/uL (ref 0.1–1.0)
Monocytes Relative: 6.4 % (ref 3.0–12.0)
Neutro Abs: 2.9 10*3/uL (ref 1.4–7.7)
Neutrophils Relative %: 50.7 % (ref 43.0–77.0)
Platelets: 225 10*3/uL (ref 150.0–400.0)
RBC: 4.35 Mil/uL (ref 3.87–5.11)
RDW: 12.4 % (ref 11.5–15.5)
WBC: 5.6 10*3/uL (ref 4.0–10.5)

## 2023-11-11 LAB — PROTIME-INR
INR: 1.1 ratio — ABNORMAL HIGH (ref 0.8–1.0)
Prothrombin Time: 11.3 s (ref 9.6–13.1)

## 2023-11-11 LAB — APTT: aPTT: 36.4 s (ref 25.4–36.8)

## 2023-11-11 LAB — VITAMIN D 25 HYDROXY (VIT D DEFICIENCY, FRACTURES): VITD: 42.33 ng/mL (ref 30.00–100.00)

## 2023-11-11 LAB — TSH: TSH: 1.3 u[IU]/mL (ref 0.35–5.50)

## 2023-11-11 NOTE — Assessment & Plan Note (Signed)
 Hypothyroidism: Good med compliance, check TSH. Vitamin D  deficiency: Reports she has been taking ergocalciferol  weekly for the last few months and inconsistently vitamin D  2000 units daily.  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  For completeness we will check a CMP CBC PT and PTT.  Explained to patient this is a benign condition. RTC November 2025 CPX

## 2023-11-11 NOTE — Patient Instructions (Signed)
   GO TO THE LAB :  Get the blood work   Your results will be posted on MyChart with my comments  Next office visit for a physical exam by November 2025 Please make an appointment before you leave today

## 2023-11-11 NOTE — Progress Notes (Signed)
   Subjective:    Patient ID: Rachel Maynard, female    DOB: [birth year], 62 y.o.   MRN: [id number]  DOS:  11/11/2023 Type of visit - description: Routine office visit  Chronic medical problems addressed. Feeling well in general, still have easy bruising on the skin. Denies any unusual bleeding when she brushes her teeth.  No GI or GU bleeding.  Review of Systems See above   Past Medical History:  Diagnosis Date   BCC (basal cell carcinoma of skin)     s/p MOHs 2009, sees derm   Colon polyps    Dyspnea    DOE- CP, 3-10 normal stress  ECHO, 4-10 CT neg for PE, saw pulmonary   Genital herpes    On Valtrex    Hypothyroidism     Past Surgical History:  Procedure Laterality Date   APPENDECTOMY  1980   CESAREAN SECTION  2012   CHOLECYSTECTOMY  2004   SHOULDER ARTHROSCOPY Left 05/2018   Uterine ablation      Current Outpatient Medications  Medication Instructions   cholecalciferol (VITAMIN D3) 2,000 Units, Daily   levothyroxine  (SYNTHROID ) 100 mcg, Oral, Daily before breakfast   loratadine (CLARITIN) 10 mg, Oral, Daily   valACYclovir  (VALTREX ) 1000 MG tablet TAKE ONE-HALF (1/2) TABLET THREE TIMES A DAY FOR 3 DAYS PER OUTBREAK       Objective:   Physical Exam BP 118/66   Pulse (!) 51   Temp 98.1 F (36.7 C) (Oral)   Resp 16   Ht 5\' 3"  (1.6 m)   Wt 165 lb 6 oz (75 kg)   SpO2 97%   BMI 29.29 kg/m  General:   Well developed, NAD, BMI noted. HEENT:  Normocephalic . Face symmetric, atraumatic Skin: At the left forearm has two less than 1/2 cm areas of superficial bleeding consistent with capillary fragility Neurologic:  alert & oriented X3.  Speech normal, gait appropriate for age and unassisted Psych--  Cognition and judgment appear intact.  Cooperative with normal attention span and concentration.  Behavior appropriate. No anxious or depressed appearing.      Assessment     Assessment Hypothyroidism Genital herpes, Valtrex  Menopausal  Peanuts allergy--  epipen  BCC, Moh's  surgery 2009,  Sees dermatology q year Vit d def  Hepatic asteatosis, per US  02-2021; [facility].  DOE, chest pain: CT Chest (-)  PE, normal stress echo, saw pulmonary   PLAN Hypothyroidism: Good med compliance, check TSH. Vitamin D  deficiency: Reports she has been taking ergocalciferol  weekly for the last few months and inconsistently vitamin D  2000 units daily.  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  For completeness we will check a CMP CBC PT and PTT.  Explained to patient this is a benign condition. RTC November 2025 CPX

## 2023-11-12 ENCOUNTER — Ambulatory Visit: Payer: Self-pay | Admitting: Internal Medicine

## 2023-11-28 ENCOUNTER — Other Ambulatory Visit: Payer: Self-pay | Admitting: Internal Medicine

## 2024-03-15 LAB — HM MAMMOGRAPHY

## 2024-03-18 ENCOUNTER — Telehealth: Payer: Self-pay

## 2024-03-18 NOTE — Telephone Encounter (Signed)
**Note De-identified  [place] Obfuscation** Please advise 

## 2024-03-18 NOTE — Telephone Encounter (Signed)
 UTD check, no significant interactions

## 2024-03-18 NOTE — Telephone Encounter (Signed)
 Copied from CRM [id number]. Topic: General - Other >> Mar 18, 2024  9:26 AM Rosina BIRCH wrote: Reason for CRM: patient called stating she was prescribed eye drops called xdemvy and the eye doctor wanted her to check with her provider to make sure it does not interfere with her other medication   [phone]

## 2024-03-19 NOTE — Telephone Encounter (Signed)
 LMOM informing per PCP no significant interactions.

## 2024-03-29 ENCOUNTER — Encounter: Payer: Self-pay | Admitting: Internal Medicine

## 2024-05-04 ENCOUNTER — Encounter: Payer: Self-pay | Admitting: Internal Medicine

## 2024-06-01 ENCOUNTER — Encounter: Admitting: Internal Medicine

## 2024-10-12 ENCOUNTER — Encounter: Admitting: Internal Medicine
